# Patient Record
Sex: Male | Born: 1935 | Race: White | Hispanic: No | Smoking: Former smoker
Health system: Southern US, Community
[De-identification: ages and names within clinical notes are randomized; demographics above are authoritative.]

## PROBLEM LIST (undated history)

## (undated) DIAGNOSIS — I10 Essential (primary) hypertension: Secondary | ICD-10-CM

## (undated) DIAGNOSIS — E785 Hyperlipidemia, unspecified: Secondary | ICD-10-CM

## (undated) DIAGNOSIS — I639 Cerebral infarction, unspecified: Secondary | ICD-10-CM

## (undated) HISTORY — PX: ROTATOR CUFF REPAIR: SHX139

## (undated) HISTORY — PX: LUMBAR DISC SURGERY: SHX700

## (undated) HISTORY — PX: LUMBAR FUSION: SHX111

## (undated) HISTORY — PX: CLEFT LIP REPAIR: SUR1164

---

## 1999-06-02 ENCOUNTER — Encounter: Admission: RE | Admit: 1999-06-02 | Discharge: 1999-06-02 | Payer: Self-pay | Admitting: *Deleted

## 1999-06-02 ENCOUNTER — Encounter: Payer: Self-pay | Admitting: *Deleted

## 1999-06-03 ENCOUNTER — Encounter: Payer: Self-pay | Admitting: Urology

## 1999-06-03 ENCOUNTER — Encounter: Admission: RE | Admit: 1999-06-03 | Discharge: 1999-06-03 | Payer: Self-pay | Admitting: Urology

## 1999-06-13 ENCOUNTER — Encounter: Payer: Self-pay | Admitting: Urology

## 1999-06-15 ENCOUNTER — Observation Stay (HOSPITAL_COMMUNITY): Admission: RE | Admit: 1999-06-15 | Discharge: 1999-06-16 | Payer: Self-pay | Admitting: Urology

## 2001-01-08 ENCOUNTER — Encounter: Admission: RE | Admit: 2001-01-08 | Discharge: 2001-01-08 | Payer: Self-pay | Admitting: *Deleted

## 2001-01-08 ENCOUNTER — Encounter: Payer: Self-pay | Admitting: *Deleted

## 2001-03-20 ENCOUNTER — Ambulatory Visit (HOSPITAL_COMMUNITY): Admission: RE | Admit: 2001-03-20 | Discharge: 2001-03-20 | Payer: Self-pay | Admitting: Gastroenterology

## 2002-02-19 ENCOUNTER — Encounter: Payer: Self-pay | Admitting: Neurosurgery

## 2002-02-25 ENCOUNTER — Encounter: Payer: Self-pay | Admitting: Neurosurgery

## 2002-02-25 ENCOUNTER — Inpatient Hospital Stay (HOSPITAL_COMMUNITY): Admission: RE | Admit: 2002-02-25 | Discharge: 2002-03-01 | Payer: Self-pay | Admitting: Neurosurgery

## 2002-04-01 ENCOUNTER — Encounter: Admission: RE | Admit: 2002-04-01 | Discharge: 2002-04-01 | Payer: Self-pay | Admitting: Neurosurgery

## 2002-04-01 ENCOUNTER — Encounter: Payer: Self-pay | Admitting: Neurosurgery

## 2002-06-02 ENCOUNTER — Encounter: Payer: Self-pay | Admitting: Neurosurgery

## 2002-06-02 ENCOUNTER — Encounter: Admission: RE | Admit: 2002-06-02 | Discharge: 2002-06-02 | Payer: Self-pay | Admitting: Neurosurgery

## 2002-12-24 ENCOUNTER — Encounter: Admission: RE | Admit: 2002-12-24 | Discharge: 2002-12-24 | Payer: Self-pay | Admitting: Neurosurgery

## 2003-05-07 ENCOUNTER — Encounter: Admission: RE | Admit: 2003-05-07 | Discharge: 2003-05-07 | Payer: Self-pay | Admitting: Neurosurgery

## 2006-12-10 ENCOUNTER — Ambulatory Visit (HOSPITAL_COMMUNITY): Admission: RE | Admit: 2006-12-10 | Discharge: 2006-12-10 | Payer: Self-pay | Admitting: Ophthalmology

## 2007-12-21 ENCOUNTER — Inpatient Hospital Stay (HOSPITAL_COMMUNITY): Admission: EM | Admit: 2007-12-21 | Discharge: 2007-12-25 | Payer: Self-pay | Admitting: Emergency Medicine

## 2007-12-23 ENCOUNTER — Ambulatory Visit: Payer: Self-pay | Admitting: Vascular Surgery

## 2007-12-23 ENCOUNTER — Encounter (INDEPENDENT_AMBULATORY_CARE_PROVIDER_SITE_OTHER): Payer: Self-pay | Admitting: Internal Medicine

## 2008-06-30 ENCOUNTER — Encounter: Admission: RE | Admit: 2008-06-30 | Discharge: 2008-06-30 | Payer: Self-pay | Admitting: Otolaryngology

## 2008-07-09 ENCOUNTER — Ambulatory Visit (HOSPITAL_COMMUNITY): Admission: RE | Admit: 2008-07-09 | Discharge: 2008-07-09 | Payer: Self-pay | Admitting: Otolaryngology

## 2008-07-23 ENCOUNTER — Emergency Department (HOSPITAL_COMMUNITY): Admission: EM | Admit: 2008-07-23 | Discharge: 2008-07-23 | Payer: Self-pay | Admitting: Emergency Medicine

## 2008-12-01 ENCOUNTER — Observation Stay (HOSPITAL_COMMUNITY): Admission: EM | Admit: 2008-12-01 | Discharge: 2008-12-03 | Payer: Self-pay | Admitting: Emergency Medicine

## 2010-04-13 LAB — BASIC METABOLIC PANEL
CO2: 26 mEq/L (ref 19–32)
CO2: 28 mEq/L (ref 19–32)
Chloride: 103 mEq/L (ref 96–112)
Chloride: 106 mEq/L (ref 96–112)
GFR calc Af Amer: >60 mL/min (ref >60)
GFR calc non Af Amer: >60 mL/min (ref >60)
Glucose, Bld: 101 mg/dL — ABNORMAL HIGH (ref 70–99)
Potassium: 4.1 mEq/L (ref 3.5–5.1)
Sodium: 136 mEq/L (ref 135–145)
Sodium: 140 mEq/L (ref 135–145)

## 2010-04-13 LAB — ETHANOL: Alcohol, Ethyl (B): 172 mg/dL — ABNORMAL HIGH (ref 0–10)

## 2010-04-13 LAB — LACTIC ACID, PLASMA: Lactic Acid, Venous: 3.4 mmol/L — ABNORMAL HIGH (ref 0.5–2.2)

## 2010-04-13 LAB — COMPREHENSIVE METABOLIC PANEL
Alkaline Phosphatase: 52 U/L (ref 39–117)
BUN: 9 mg/dL (ref 6–23)
Creatinine, Ser: 0.74 mg/dL (ref 0.4–1.5)
Glucose, Bld: 131 mg/dL — ABNORMAL HIGH (ref 70–99)
Potassium: 3.1 mEq/L — ABNORMAL LOW (ref 3.5–5.1)
Total Bilirubin: 0.9 mg/dL (ref 0.3–1.2)
Total Protein: 7.4 g/dL (ref 6.0–8.3)

## 2010-04-13 LAB — POCT I-STAT, CHEM 8
Chloride: 103 mEq/L (ref 96–112)
Creatinine, Ser: 0.9 mg/dL (ref 0.4–1.5)
HCT: 42 % (ref 39.0–52.0)
Hemoglobin: 14.3 g/dL (ref 13.0–17.0)
Potassium: 3.2 mEq/L — ABNORMAL LOW (ref 3.5–5.1)
Sodium: 139 mEq/L (ref 135–145)

## 2010-04-13 LAB — CBC
HCT: 35.3 % — ABNORMAL LOW (ref 39.0–52.0)
Hemoglobin: 12.4 g/dL — ABNORMAL LOW (ref 13.0–17.0)
Hemoglobin: 12.9 g/dL — ABNORMAL LOW (ref 13.0–17.0)
MCHC: 35 g/dL (ref 30.0–36.0)
MCV: 93.8 fL (ref 78.0–100.0)
MCV: 94.6 fL (ref 78.0–100.0)
Platelets: 195 10*3/uL (ref 150–400)
RBC: 3.73 MIL/uL — ABNORMAL LOW (ref 4.22–5.81)
RBC: 3.92 MIL/uL — ABNORMAL LOW (ref 4.22–5.81)
RBC: 4.3 MIL/uL (ref 4.22–5.81)
WBC: 7 10*3/uL (ref 4.0–10.5)
WBC: 9.7 10*3/uL (ref 4.0–10.5)

## 2010-04-13 LAB — APTT: aPTT: 34 seconds (ref 24–37)

## 2010-04-13 LAB — PROTIME-INR
INR: 0.89 (ref 0.00–1.49)
Prothrombin Time: 12 seconds (ref 11.6–15.2)

## 2010-05-24 NOTE — H&P (Signed)
NAME:  Marc Malone, Marc Malone NO.:  [id number]   MEDICAL RECORD NO.:  [id number]          PATIENT TYPE:  EMS   LOCATION:  MAJO                         FACILITY:  [facility]   PHYSICIAN:  Corinna L. Lendell Caprice, MDDATE OF BIRTH:  27-Aug-1919   DATE OF ADMISSION:  12/21/2007  DATE OF DISCHARGE:                              HISTORY & PHYSICAL   CHIEF COMPLAINT:  Difficulty walking.   HISTORY OF PRESENT ILLNESS:  Ms. Marc Malone is a pleasant 77 year old white  male who woke up at around nine this morning and had difficulty walking.  His left leg and  arm seemed weak and clumsy.  He fell and nicked his  left hand.  He has no headache, no nausea, and he denies slurred speech  but there is report from friend that he apparently had slurred speech.  He thinks that his left-sided weakness might be improving slightly since  this morning.  He presented to the emergency room at about 3 o'clock  today.  He thought his weakness might resolve on its own.  He has never  had a stroke before.  He has hypertension and is otherwise quite healthy  and independent.  He lives alone in an independent living facility.   PAST MEDICAL HISTORY:  Hypertension, degenerative disc disease and  multiple laminectomies for same, history of posterior vitrectomy and  removal of foreign body of the left eye with posterior chamber  intraocular lens in 2008.   SOCIAL HISTORY:  The patient is not married.  He has a girlfriend.  He  has two children who lives out of state.  He quit smoking 20 years ago.  He drinks three martinis a day.   FAMILY HISTORY:  Noncontributory.   REVIEW OF SYSTEMS:  As above, otherwise negative.   PHYSICAL EXAMINATION:  Temperature is 98.3, blood pressure 165/80, pulse  66, respiratory rate 16, oxygen saturation 98% on room air.  In general, the patient is well-nourished, well-developed, in no acute  distress.  There are no friends or family present.  HEENT:  Normocephalic, atraumatic.  He  has an irregular left pupil.  His  right pupil is round and reactive.  Moist mucous membranes.  He has a  slight flattening of the left nasolabial fold.  He has a healed incision  over the right side of his upper lip.  NECK: Supple.  No carotid bruits.  No lymphadenopathy.  No thyromegaly.  LUNGS:  Clear to auscultation bilaterally without wheezes, rhonchi or  rales.  CARDIOVASCULAR:  Regular rate and rhythm without murmurs, gallops or  rubs ABDOMEN:  Normal bowel sounds, soft, nontender, nondistended.  GU AND RECTAL:  Deferred.  EXTREMITIES: No clubbing, cyanosis or edema.  Pulses are intact.  SKIN:  He has an abrasion over his left hand without any bleeding.  No  rash.  NEUROLOGIC:  The patient is alert and oriented x3.  slight flattening of  the left nasal labial fold.  He is unwilling or unable to cooperate with  visual field testing but otherwise his cranial nerves appear to be  intact.  Motor strength 5/5 bilaterally.  Sensation intact.  He has  dysmetria and clumsiness of the left hand.  I did not test gait due to  safety reasons nor did I test  Romberg.  His right patellar tendon  reflex is 1+, left is 2+.  Babinski is downgoing.  His speech is  slightly hesitant and possibly slightly slurred.  PSYCHIATRIC:  The patient is cooperative and somewhat in inappropriately  jovial.   LABORATORY DATA:  CBC, PT/PTT unremarkable.  Complete metabolic panel  significant for a total bilirubin of 1.7, calcium 10.6, otherwise  unremarkable.  Cardiac enzymes negative.  EKG shows normal sinus  rhythm..  Chest x-ray shows nothing acute.  CT of the brain shows age-  related atrophy and chronic small vessel disease, nothing acute   ASSESSMENT/PLAN:  1. Cerebrovascular infarct with left-sided clumsiness/weakness.  The      patient is outside the window of thrombolytics.  I will order      MRI/MRA of the brain, carotid Dopplers, echocardiogram, fasting      lipids, homocysteine, PT/OT consult.   He may be a candidate for      inpatient rehab.  I will start him on an aspirin a day and consider      a neuro consult.  2. Hypertension.  I will resume amlodipine and hold benazepril for now      to avoid relative hypotension in the setting of stroke.  3. History of daily alcohol use.  I will give thiamine.  The patient      denies drinking to excess or history of DTs.      Corinna L. Lendell Caprice, MD  Electronically Signed     CLS/MEDQ  D:  12/21/2007  T:  12/21/2007  Job:  [id number]   cc:   Thora Lance, M.D.

## 2010-05-24 NOTE — Discharge Summary (Signed)
NAME:  Marc Malone, Marc Malone NO.:  [id number]   MEDICAL RECORD NO.:  [id number]          PATIENT TYPE:  INP   LOCATION:  3039                         FACILITY:  [facility]   PHYSICIAN:  Thora Lance, M.D.  DATE OF BIRTH:  April 08, 1919   DATE OF ADMISSION:  12/21/2007  DATE OF DISCHARGE:  12/25/2007                               DISCHARGE SUMMARY   REASON FOR ADMISSION:  Marc Malone is an 77 year old white male who woke  up the morning of admission, had difficulty walking.  His left leg and  arm seemed to be weak and clumsy.  He fell and nicked his left hand.  He  presented to the emergency room at three in the afternoon.   SIGNIFICANT FINDINGS:  Temperature 98.3, blood pressure 165/80, heart  rate 66, respirations 16.  NECK:  No bruits.  LUNGS:  Clear.  HEART:  Regular rate and rhythm.  ABDOMEN:  Benign.  EXTREMITIES:  No edema.  NEUROLOGIC:  Alert and oriented x3, a slight flattening of nasolabial  fold, cranial nerves intact, dysmetria and clumsiness in the left hand  his.   LABORATORY DATA:  CBC:  Hemoglobin 14.2, hematocrit 41.7, WBC 6,  platelet 180.  Chemistry:  Sodium 140, potassium 3.8, chloride 104,  bicarbonate 29, BUN 14, creatinine 0.7, glucose 104.  Cardiac enzymes  negative.  Urinalysis was negative.  Lipid profile showed a cholesterol  of 248, LDL 154, HDL 81, triglycerides 66.   CT scan of the brain showed age-related atrophy and chronic small vessel  disease and no acute insult.   HOSPITAL COURSE:  The patient was admitted for a right brain stroke.  He  was placed on aspirin and then switched to Aggrenox.  He had a  significant headache on the Aggrenox and at discharge he was switched  from Aggrenox to Plavix with aspirin being stopped.  Clinically he  improved and had only very mild weakness in the left side at discharge.  He had some slurring of speech initially which seemed to improve.  He  had an MRI of the brain which did show an acute  infarction affecting the  right cerebral peduncle and thalamus.  An MRA of the brain showed no  occlusion or proximal vessel stenosis, there were some atherosclerotic  irregularities in the median small vessels diffusely.  A carotid Doppler  was done and showed no hemodynamically significant ICA stenosis  bilaterally and antegrade vertebral artery flow.  A 2D echocardiogram  showed normal ejection fraction and mild left atrial enlargement.  The  patient was seen by physical therapy and occupational therapy and both  felt that a short period of rehabilitation was needed prior to return  home to independent living.  The patient's blood pressure at discharge  on just amlodipine was running between 123 and 131 over 60s to 70s.  His  benazepril was not restarted.  He was placed on lipid lowering agent  simvastatin which will be continued as an outpatient.   DISCHARGE DIAGNOSES:  1. Right brain cerebrovascular accident with left hemiparesis.  2. Hypertension.  3. Chronic insomnia.  4. Lumbar degenerative  disk disease and chronic back pain.  5. Osteoporosis.  6. Nephrolithiasis.  7. Primary hyperparathyroidism.  8. Depression 2003.  9. Hypogonadism/erectile dysfunction.  10.Adenomatous colon polyps.  11.Benign prostatic hypertrophy and elevated PSA.   PROCEDURES:  1. CT scan of the brain.  2. MRI of the brain.  3. MRA of the brain.  4. Carotid ultrasound.  5. A 2D echocardiogram.   DISCHARGE MEDICATIONS:  1. Amlodipine 5 mg p.o. daily.  2. Plavix 75 mg p.o. daily.  3. Simvastatin 20 mg daily p.o. q.p.m.  4. Colace 100 grams b.i.d.  5. Ambien CR 12.5 mg nightly p.r.n.  6. Multivitamin daily.  7. Actonel 35 mg q. Week.  8. Os-Cal plus D 500 mg daily.  9. Laxative of choice.   DISPOSITION:  Discharged to [facility].   DIET:  Low-sodium diet.   ACTIVITY:  As per physical therapy and occupational therapy.   FOLLOWUP:  With Dr. Kirby Funk after release from  nursing home.           ______________________________  Thora Lance, M.D.     JJG/MEDQ  D:  12/25/2007  T:  12/25/2007  Job:  [id number]

## 2010-05-24 NOTE — Op Note (Signed)
NAME:  Marc Malone, Marc Malone NO.:  [id number]   MEDICAL RECORD NO.:  [id number]          PATIENT TYPE:  AMB   LOCATION:  SDS                          FACILITY:  [facility]   PHYSICIAN:  Alford Highland. Rankin, M.D.   DATE OF BIRTH:  [birth year]   DATE OF PROCEDURE:  DATE OF DISCHARGE:                               OPERATIVE REPORT   PREOPERATIVE DIAGNOSES:  1. Retained lens fragments left eye - nonmagnetic foreign body.  2. Aphakia left eye.   POSTOPERATIVE DIAGNOSES:  1. Retained lens fragments left eye - nonmagnetic foreign body.  2. Aphakia left eye.   PROCEDURES:  1. Posterior vitrectomy left eye.  2. Removal of nonmagnetic foreign body.  3. Insertion of primary posterior chamber intraocular lens into the      sulcus, model [id number] Alcon, power +21.5.   SURGEON:  Alford Highland. Rankin, M.D.   ANESTHESIA:  Local monitored anesthesia control.   INDICATIONS FOR PROCEDURE:  Patient is an 77 year old man who had  attempted cataract surgery earlier this morning with complication of  posterior capsular disruption and dislocation of the lens and nucleus  into the vitreous cavity.  This is an attempt to remove the lens.  This  is also an attempt to place a posterior chamber lens into the sulcus to  rejuvenate and to actually repair the aphakic state to a pseudophakic  state.  Patient and the family understand the risk of anesthesia  including recurrence, death, loss of the eye, including but not limited  to hemorrhage, infection, scarring, need for further surgery, no change  in vision, loss of vision, progression of disease despite intervention.  Appropriate signed consent was obtained.  Patient was taken to the  operating room.  In the operating room, appropriate monitors followed by  mild sedation.  2% Xylocaine was then injected retrobulbar for an  additional 5 mL lateral to fashion a modified Darel Hong.  The left  periocular region was sterilely prepped and draped in the usual  ophthalmic fashion.  A lids speculum was applied.  A 25-gauge trocar was  placed into the inferotemporal quadrant.  Superior trocars applied.  Core vitrectomy was then begun.  Large nucleus and cortical fragments  were seen over the posterior pole.  The anterior vitreous and the core  vitreous was removed so as to gain access to this nuclear fragment  without engaging the vitreous.  The vitreous skirt trimmed 360 degrees.  MVR blade was then used to make a separate superonasal sclerotomy for  the fragmatome to be used.  The fragmatome was then placed without  difficulty in the vitreous cavity and phacofragmentation was carried out  in the vitreous cavity without difficulty.   Lens was removed in this fashion.  The sclerotomy was then closed with 7-  0 Vicryl.  Remnants of the cortex were then cleaned up with vitrector  instrumentation.  Care was taken to clear the vitreous from the visual  axis.  At this time, the plugs were placed in the superior trocars.  Thereafter, crescent blade used to create a grooved limbal incision.  MVR blade was then used  to enter the anterior chamber.  The anterior  chamber was deep with Viscoat and the posterior chamber was then opened  up as well.  The intraocular lens was placed into the sulcus and rotated  in horizontal position without difficulty.  As the guy was hypotenuse  during this portion of the procedure, a small site of bleeding from the  6 o'clock position in the anterior chamber angle was noted.  This did  not disperse and was kept in place by placing further Viscoat.  The  anterior chamber wound was then closed with interrupted 10-0 nylon  sutures.  The wound was found to be secure.  The corneal wound  temporally was judged to be secure from earlier procedure today.   At this time, the superior trocars were removed.  Conjunctiva was then  closed with 7-0 Vicryl.  The infusion was removed.  A second look prior  to the closure and posterior  vitreous had also removed all remaining  visible fragments of the lens.   At this time, subconjunctival Decadron applied.  Sterile patch and Fox  shield applied.  Patient was taken to the PACU in good stable position.      Alford Highland Rankin, M.D.  Electronically Signed     GAR/MEDQ  D:  12/10/2006  T:  12/11/2006  Job:  [id number]   cc:   Molly Maduro L. Dione Booze, M.D.

## 2010-05-27 NOTE — Op Note (Signed)
NAME:  Marc Malone, Marc Malone NO.:  [id number]   MEDICAL RECORD NO.:  [id number]                   PATIENT TYPE:  INP   LOCATION:  3172                                 FACILITY:  [facility]   PHYSICIAN:  Kathaleen Maser. Pool, M.D.                 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   OPERATION PERFORMED:  Re-exploration of L4-5 laminectomy with bilateral L4-5  foraminotomies.  L4-5 posterior lumbar interbody fusion utilizing tangent  wedges and local autograft.  L4-5 posterolateral fusion utilizing pedicle  screw instrumentation and local autograft.   SURGEON:  Kathaleen Maser. Pool, M.D.   ASSISTANT:  Reinaldo Meeker, M.D.   ANESTHESIA:  General endotracheal.   INDICATIONS FOR PROCEDURE:  The patient is an 77 year old male with history  of a previous L3-4 and L4-5 decompressive laminectomy.  The patient presents  with intractable right lower extremity pain failing all conservative  management.  The symptoms are most consistent with a right-sided L4  radiculopathy but there is some degree of right-sided L5 symptoms as well.  The patient has an MRI scan which demonstrates a post laminectomy  spondylolisthesis at L4-5 with severe foraminal stenosis and a marked  osteophytic spur causing severe compression of the right-sided L5 nerve  root.  The patient has been counseled as to his options.  He has decided to  proceed with an L4-5 decompressive laminectomy with foraminotomies followed  interbody fusion augmented with posterolateral fusion and instrumentation.   DESCRIPTION OF PROCEDURE:  The patient was taken to the operating room and  placed on the table in the supine position.   After adequate level of  anesthesia was achieved, the patient was positioned prone onto a Wilson  frame and appropriately padded.  The patient's lumbar region was prepped and  draped sterilely.  A 10 blade was used to make a linear skin incision  overlying the  L3, 4, and 5 levels.  This was carried down sharply in the  midline.  A subperiosteal dissection was then performed exposing the lamina  and facet joints of L4, L5 and the transverse processes of L4 and L5.  Deep  self-retaining retractor was placed.  Intraoperative fluoroscopy was used  and the levels were confirmed.  The previous laminectomy site was dissected  free using dental instruments.  The laminectomy at L4 was completed using  Leksell rongeurs, Kerrison rongeurs and a high speed drill to remove the  entire lamina at L4 as well as the inferior facets of L4 bilaterally and the  majority of the superior facets of L5 bilaterally.  The superior aspect of  the L5 lamina was also resected.  All  bone was cleaned and used in later  autografting.  The ligamentum flavum and scar was then elevated and resected  in piecemeal fashion using Kerrison rongeurs.  The underlying thecal sac was  identified.  Wide foraminotomy was then performed along the course of the  exiting L4 and L5 nerve roots bilaterally.  Epidural venous plexus was  coagulated and cut.  Starting first with the patient's left side, the thecal  sac and nerve roots were retracted toward the midline.  The disk space was  incised with a 15 blade in rectangular fashion.  A wide disk space cleanout  was then achieved using pituitary rongeurs, upward angled pituitary  rongeurs, and Epstein curets.  After an aggressive diskectomy was performed,  attention was then placed to the contralateral side.  The contralateral side  was then once again protected and incised with a 15 blade in rectangular  fashion.  A wide disk space cleanout was then achieved using pituitary  rongeurs,  forward and backward angled Carlens curets, Kerrison rongeurs and  upward angled pituitary rongeurs.  After a very thorough diskectomy had been  performed.  The disk space was then sequentially distracted up to 10 mm with  a 10 mm distractor left in the patient's right side.  The thecal sac and  nerve roots were then protected o the left.  The disk space was then reamed  with a 10 mm box cutter and then cut with a 10 mm tangent chisel.  Soft  tissue was removed from the interspace.  A 10 x 26 mm tangent wedge was then  impacted into place and recessed approximately 2 mm from the posterior  cortical margin.  The distractor was removed from the patient's right side.  Nerve roots were protected.  The disk space was then reamed and then cut  with a 10 mm chisel once again.  Soft tissue was removed.  Disk space was  further curettaged. Morselized autograft was then packed into the interspace  and a second 10 x 26 mm tangent wedge was then impacted into place.  Intraoperative x-ray revealed good position of the bone grafts with good  reduction of the patient's deformity.  Pedicles at L4 and L5 were then  isolated using surface landmarks and intraoperative fluoroscopy.  Superficial bone overlying the pedicle was removed using a high speed drill.  Each pedicle was then probed using a pedicle awl.  Each pedicle awl track  was found to be solidly within bone.  Each pedicle awl track was then tapped  with a 5.25 mm screw tap.  Each screw tap  hole was then probed and found to  be solidly within bone.  6.75 x 45 mm spiral 90 screws were then placed  bilaterally at L4.  6.75 x 40 mm spiral 90 screws were placed bilaterally at  L5.  Transverse processes of L4 and L5 were then decorticated using a high  speed drill.  Morselized autograft was packed posterolaterally.  Short  segment titanium rod was then contoured and placed over the screw heads at L4 and L5.  Locking caps were then placed over the screw  heads.  Locking  caps were then engaged in sequential fashion while placing the construct  under compression.  Final images revealed good position of bone grafts and  hardware at the proper operative level with normal alignment of the spine.  Blunt probes passed easily out each neural foramen.  There was no evidence  of  injury to thecal sac or  nerve roots.  The wound was then irrigated one  final time.  Gelfoam was placed topically for hemostasis which was found to  be good.  Hemostasis in the muscle was achieved with electrocautery.  The  wound was then closed in layers with Vicryl sutures.  Steri-Strips and  sterile dressing were applied.  There were no apparent complications.  The  patient tolerated the procedure well and returned to the recovery room  postoperatively.                                                Henry A. Pool, M.D.    HAP/MEDQ  D:  02/25/2002  T:  02/25/2002  Job:  [id number]

## 2010-05-27 NOTE — Op Note (Signed)
[facility]  Patient:    Marc Malone, Marc Malone                      MRN: [id number] Proc. Date: 06/15/99 Adm. Date:  [id number] Disc. Date: [id number] Attending:  Laqueta Jean                           Operative Report  PREOPERATIVE DIAGNOSIS:  Right lower ureteral calculi.  POSTOPERATIVE DIAGNOSIS:  Right lower ureteral calculi.  OPERATION:  Cystourethroscopy, right retrograde pyelogram, right ureteroscopy, basket extraction of right lower ureteral calculi, right double J catheter.  SURGEON:  Sigmund I. Patsi Sears, M.D.  ANESTHESIA:  General (LMA).  PREPARATION:  After appropriate preanesthesia, the patient was brought to the operating room and placed on the operating table in the dorsal supine position, where general anesthesia was introduced.  He was then replaced in the low Allen stirrup dorsal lithotomy position, where the pubis was prepped with Betadine solution and draped in the usual sterile fashion.  PROCEDURE:  Cystourethroscopy was accomplished, which showed a hypertrophied prostate, somewhat edematous.  Right retrograde pyelogram was then performed, which showed two rather large calculi in the right lower third ureter, at the level of the pelvic brim, with dilation of the ureter at the level of the stones.  Ureteroscopy was accomplished and showed that the two stones were identifiable and were extracted separately.  There was difficulty in extracting the stones because of their unusual plate-like shape as well as size.  However, using multiple baskets, stones were extracted.  A large amount of clot was noted within the ureter as well as in the bladder and prostate, and a Foley catheter was placed.  Because of the patients age and because of the degree of difficulty of the procedure, it was elected to admit the patient for 23-hour observation.  I discussed this with the patients wife.  We will plan for 23-hour observation.  The patient  was given Xylocaine jelly in the urethra and in the ureter and IV Toradol prior to awakening.  He was covered with Ancef for the case. DD:  06/15/99 TD:  06/19/99 Job: [id number]

## 2010-05-27 NOTE — Procedures (Signed)
[facility]  Patient:    Marc Malone, BALDREE Visit Number: [id number] MRN: [id number]          Service Type: Attending:  Verlin Grills, M.D. Dictated by:   Verlin Grills, M.D. Proc. Date: 03/20/01                             Procedure Report  PROCEDURE:  Screening colonoscopy.  PROCEDURE INDICATION:  Mr. Khair Chasteen is a 77 year old male born 1919-01-25. Mr. Ditullio has a history of neoplastic but noncancerous colon polyps removed at [facility]. He has been undergoing routine surveillance colonoscopies since removal of the polyps. He is due for a repeat screening colonoscopy with polypectomy to prevent colon cancer.  I discussed with Mr. Willems the complications associated with colonoscopy and polypectomy including a 15 per 1000 risk of bleeding and 4 per 100 risk of colon perforation requiring surgical repair. Mr. Scalia has signed the operative permit.  ENDOSCOPIST:  Verlin Grills, M.D.  PREMEDICATION:  Versed 5 mg, Demerol 20 mg.  ENDOSCOPE:  Olympus Pediatric colonoscope.  DESCRIPTION OF PROCEDURE:  After obtaining informed consent, Mr. Vester was placed in the left lateral decubitus position. I administered intravenous Demerol and intravenous Versed to achieve conscious sedation for the procedure. The patients blood pressure, oxygen saturation and cardiac rhythm were monitored throughout the procedure and documented in the medical record.  Anal inspection was normal. Digital rectal exam revealed an enlarged but nonnodular prostate. The Olympus Pediatric video colonoscope was introduced into the rectum and advanced to the cecum. Mr. Mcglory received the Fleet Phospho-Soda colonic prep. The cecum was poorly prepped. There was adherent, sticky stool covering about 50% of the cecal mucosa which I could not flush out with water. Otherwise, colonic preparation for the exam today  was satisfactory.  Rectum:  Normal.  Sigmoid colon and descending colon:  Normal.  Splenic flexure:  Normal.  Transverse colon:  Normal.  Hepatic flexure:  Normal.  Ascending colon:  Normal.  Cecum and ileocecal valve:  Normal.  ASSESSMENT:  Normal proctocolonoscopy to the cecum. Marginal colonic preparation in the cecum.  RECOMMENDATIONS:  Repeat colonoscopy in approximately five years. Dictated by:   Verlin Grills, M.D. Attending:  Verlin Grills, M.D. DD:  03/20/01 TD:  03/20/01 Job: [id number]

## 2010-05-27 NOTE — Discharge Summary (Signed)
   NAME:  Marc Malone, Marc Malone NO.:  [id number]   MEDICAL RECORD NO.:  [id number]                   PATIENT TYPE:  INP   LOCATION:  3001                                 FACILITY:  [facility]   PHYSICIAN:  Kathaleen Maser. Pool, M.D.                 DATE OF BIRTH:  [birth year]   DATE OF ADMISSION:  02/25/2002  DATE OF DISCHARGE:  03/01/2002                                 DISCHARGE SUMMARY   FINAL DIAGNOSIS:  L4-5 grade 1 post laminectomy spondylolisthesis with  stenosis.   OPERATIONS AND TREATMENTS:  L4-5 redo decompressive laminectomy with  posterior lumbar interbody fusion utilizing Tangent wedges and local  autograft coupled with posterolateral fusion utilizing pedicle screw  fixation and local autograft.   HISTORY OF PRESENT ILLNESS:  Mr. Marc Malone is an 77 year old male who has a  history of a previous do-over lumbar decompression.  The patient presents  with worsening back and right lower extremity pain, failing all conservative  management.  MRI scanning and plain films demonstrate evidence of post  laminectomy, grade 1 L4-5 spondylolisthesis with severe neuroforaminal  stenosis.  The patient presents now for decompression and fusion at the L4-5  level.   HOSPITAL COURSE:  The patient was taken to the operating room where an  uncomplicated L4-5 decompression and fusion was performed.  Postoperatively,  the patient did quite well.  Right lower extremity pain was completely  resolved.  The patient was gradually mobilized.  He underwent physical and  occupational therapy while an inpatient.  On his fourth postoperative day,  the patient was walking the halls without difficulty. Pain level was  minimal.  The wound is healing well.  He is tolerating a regular diet.  He  desired discharge.   CONDITION AT DISCHARGE:  Improved.   DISCHARGE DISPOSITION:  The patient will be followed up in my office in one  week.   DISCHARGE MEDICATIONS:  Percocet and Valium as needed.   The patient is to  resume all preoperative medications.                                               Henry A. Pool, M.D.    HAP/MEDQ  D:  04/09/2002  T:  04/09/2002  Job:  [id number]

## 2010-10-13 LAB — CBC
HCT: 41.4 % (ref 39.0–52.0)
Hemoglobin: 13.8 g/dL (ref 13.0–17.0)
Hemoglobin: 14.2 g/dL (ref 13.0–17.0)
MCHC: 34.1 g/dL (ref 30.0–36.0)
MCV: 95.6 fL (ref 78.0–100.0)
Platelets: 167 10*3/uL (ref 150–400)
RDW: 14.3 % (ref 11.5–15.5)
WBC: 6 10*3/uL (ref 4.0–10.5)

## 2010-10-13 LAB — LIPID PANEL
Cholesterol: 248 mg/dL — ABNORMAL HIGH (ref 0–200)
HDL: 81 mg/dL (ref >39)
LDL Cholesterol: 154 mg/dL — ABNORMAL HIGH (ref 0–99)
Total CHOL/HDL Ratio: 3.1 RATIO
Triglycerides: 66 mg/dL (ref <150)

## 2010-10-13 LAB — CK TOTAL AND CKMB (NOT AT ARMC): Total CK: 125 U/L (ref 7–232)

## 2010-10-13 LAB — BASIC METABOLIC PANEL
CO2: 29 mEq/L (ref 19–32)
Calcium: 10.4 mg/dL (ref 8.4–10.5)
Creatinine, Ser: 0.77 mg/dL (ref 0.4–1.5)
Glucose, Bld: 104 mg/dL — ABNORMAL HIGH (ref 70–99)

## 2010-10-13 LAB — PROTIME-INR
INR: 0.9 (ref 0.00–1.49)
Prothrombin Time: 12.6 seconds (ref 11.6–15.2)

## 2010-10-13 LAB — COMPREHENSIVE METABOLIC PANEL
ALT: 19 U/L (ref 0–53)
CO2: 29 mEq/L (ref 19–32)
Calcium: 10.6 mg/dL — ABNORMAL HIGH (ref 8.4–10.5)
GFR calc non Af Amer: >60 mL/min (ref >60)
Glucose, Bld: 96 mg/dL (ref 70–99)
Sodium: 141 mEq/L (ref 135–145)

## 2010-10-13 LAB — URINALYSIS, ROUTINE W REFLEX MICROSCOPIC
Bilirubin Urine: NEGATIVE
Glucose, UA: NEGATIVE mg/dL
Hgb urine dipstick: NEGATIVE
Ketones, ur: 15 mg/dL — AB
Protein, ur: NEGATIVE mg/dL

## 2010-10-13 LAB — DIFFERENTIAL
Basophils Relative: 1 % (ref 0–1)
Eosinophils Absolute: 0 10*3/uL (ref 0.0–0.7)
Lymphs Abs: 1.5 10*3/uL (ref 0.7–4.0)
Neutrophils Relative %: 66 % (ref 43–77)

## 2010-10-17 LAB — CBC
Hemoglobin: 15.3
RDW: 13.8

## 2010-10-17 LAB — BASIC METABOLIC PANEL
BUN: 11
CO2: 29
Calcium: 10.7 — ABNORMAL HIGH
Chloride: 105
Creatinine, Ser: 0.79
GFR calc Af Amer: >60
GFR calc non Af Amer: >60
Glucose, Bld: 94
Potassium: 3.9
Sodium: 140

## 2011-01-19 DIAGNOSIS — H40059 Ocular hypertension, unspecified eye: Secondary | ICD-10-CM | POA: Diagnosis not present

## 2011-01-19 DIAGNOSIS — H26499 Other secondary cataract, unspecified eye: Secondary | ICD-10-CM | POA: Diagnosis not present

## 2011-01-19 DIAGNOSIS — H11159 Pinguecula, unspecified eye: Secondary | ICD-10-CM | POA: Diagnosis not present

## 2011-01-19 DIAGNOSIS — Z961 Presence of intraocular lens: Secondary | ICD-10-CM | POA: Diagnosis not present

## 2011-02-21 DIAGNOSIS — L570 Actinic keratosis: Secondary | ICD-10-CM | POA: Diagnosis not present

## 2011-02-21 DIAGNOSIS — L259 Unspecified contact dermatitis, unspecified cause: Secondary | ICD-10-CM | POA: Diagnosis not present

## 2011-04-06 DIAGNOSIS — M810 Age-related osteoporosis without current pathological fracture: Secondary | ICD-10-CM | POA: Diagnosis not present

## 2011-04-06 DIAGNOSIS — M545 Low back pain: Secondary | ICD-10-CM | POA: Diagnosis not present

## 2011-04-06 DIAGNOSIS — E213 Hyperparathyroidism, unspecified: Secondary | ICD-10-CM | POA: Diagnosis not present

## 2011-04-06 DIAGNOSIS — E785 Hyperlipidemia, unspecified: Secondary | ICD-10-CM | POA: Diagnosis not present

## 2011-04-06 DIAGNOSIS — G479 Sleep disorder, unspecified: Secondary | ICD-10-CM | POA: Diagnosis not present

## 2011-06-27 DIAGNOSIS — L570 Actinic keratosis: Secondary | ICD-10-CM | POA: Diagnosis not present

## 2011-06-27 DIAGNOSIS — D485 Neoplasm of uncertain behavior of skin: Secondary | ICD-10-CM | POA: Diagnosis not present

## 2011-06-27 DIAGNOSIS — L821 Other seborrheic keratosis: Secondary | ICD-10-CM | POA: Diagnosis not present

## 2011-07-11 DIAGNOSIS — R238 Other skin changes: Secondary | ICD-10-CM | POA: Diagnosis not present

## 2011-07-11 DIAGNOSIS — D485 Neoplasm of uncertain behavior of skin: Secondary | ICD-10-CM | POA: Diagnosis not present

## 2011-07-11 DIAGNOSIS — L570 Actinic keratosis: Secondary | ICD-10-CM | POA: Diagnosis not present

## 2011-07-25 DIAGNOSIS — L570 Actinic keratosis: Secondary | ICD-10-CM | POA: Diagnosis not present

## 2011-08-01 DIAGNOSIS — H40059 Ocular hypertension, unspecified eye: Secondary | ICD-10-CM | POA: Diagnosis not present

## 2011-08-01 DIAGNOSIS — Z961 Presence of intraocular lens: Secondary | ICD-10-CM | POA: Diagnosis not present

## 2011-08-01 DIAGNOSIS — H40019 Open angle with borderline findings, low risk, unspecified eye: Secondary | ICD-10-CM | POA: Diagnosis not present

## 2011-08-02 DIAGNOSIS — N401 Enlarged prostate with lower urinary tract symptoms: Secondary | ICD-10-CM | POA: Diagnosis not present

## 2011-09-12 DIAGNOSIS — L821 Other seborrheic keratosis: Secondary | ICD-10-CM | POA: Diagnosis not present

## 2011-10-09 DIAGNOSIS — G4700 Insomnia, unspecified: Secondary | ICD-10-CM | POA: Diagnosis not present

## 2011-10-09 DIAGNOSIS — I6990 Unspecified sequelae of unspecified cerebrovascular disease: Secondary | ICD-10-CM | POA: Diagnosis not present

## 2011-10-09 DIAGNOSIS — Z1331 Encounter for screening for depression: Secondary | ICD-10-CM | POA: Diagnosis not present

## 2011-10-09 DIAGNOSIS — Z23 Encounter for immunization: Secondary | ICD-10-CM | POA: Diagnosis not present

## 2011-11-07 DIAGNOSIS — L570 Actinic keratosis: Secondary | ICD-10-CM | POA: Diagnosis not present

## 2011-11-07 DIAGNOSIS — L821 Other seborrheic keratosis: Secondary | ICD-10-CM | POA: Diagnosis not present

## 2011-12-12 DIAGNOSIS — L738 Other specified follicular disorders: Secondary | ICD-10-CM | POA: Diagnosis not present

## 2011-12-12 DIAGNOSIS — L821 Other seborrheic keratosis: Secondary | ICD-10-CM | POA: Diagnosis not present

## 2011-12-12 DIAGNOSIS — L819 Disorder of pigmentation, unspecified: Secondary | ICD-10-CM | POA: Diagnosis not present

## 2011-12-12 DIAGNOSIS — I998 Other disorder of circulatory system: Secondary | ICD-10-CM | POA: Diagnosis not present

## 2011-12-26 DIAGNOSIS — L738 Other specified follicular disorders: Secondary | ICD-10-CM | POA: Diagnosis not present

## 2011-12-26 DIAGNOSIS — L299 Pruritus, unspecified: Secondary | ICD-10-CM | POA: Diagnosis not present

## 2012-02-12 DIAGNOSIS — M25559 Pain in unspecified hip: Secondary | ICD-10-CM | POA: Diagnosis not present

## 2012-02-12 DIAGNOSIS — M545 Low back pain: Secondary | ICD-10-CM | POA: Diagnosis not present

## 2012-03-20 DIAGNOSIS — L821 Other seborrheic keratosis: Secondary | ICD-10-CM | POA: Diagnosis not present

## 2012-03-20 DIAGNOSIS — L259 Unspecified contact dermatitis, unspecified cause: Secondary | ICD-10-CM | POA: Diagnosis not present

## 2012-04-08 DIAGNOSIS — E785 Hyperlipidemia, unspecified: Secondary | ICD-10-CM | POA: Diagnosis not present

## 2012-04-08 DIAGNOSIS — Z1331 Encounter for screening for depression: Secondary | ICD-10-CM | POA: Diagnosis not present

## 2012-04-08 DIAGNOSIS — M810 Age-related osteoporosis without current pathological fracture: Secondary | ICD-10-CM | POA: Diagnosis not present

## 2012-04-08 DIAGNOSIS — Z0000 Encounter for general adult medical examination without abnormal findings: Secondary | ICD-10-CM | POA: Diagnosis not present

## 2012-04-08 DIAGNOSIS — I10 Essential (primary) hypertension: Secondary | ICD-10-CM | POA: Diagnosis not present

## 2012-05-16 DIAGNOSIS — M810 Age-related osteoporosis without current pathological fracture: Secondary | ICD-10-CM | POA: Diagnosis not present

## 2012-08-02 DIAGNOSIS — R972 Elevated prostate specific antigen [PSA]: Secondary | ICD-10-CM | POA: Diagnosis not present

## 2012-08-02 DIAGNOSIS — N401 Enlarged prostate with lower urinary tract symptoms: Secondary | ICD-10-CM | POA: Diagnosis not present

## 2012-08-02 DIAGNOSIS — N529 Male erectile dysfunction, unspecified: Secondary | ICD-10-CM | POA: Diagnosis not present

## 2012-08-06 DIAGNOSIS — H04129 Dry eye syndrome of unspecified lacrimal gland: Secondary | ICD-10-CM | POA: Diagnosis not present

## 2012-08-06 DIAGNOSIS — H11159 Pinguecula, unspecified eye: Secondary | ICD-10-CM | POA: Diagnosis not present

## 2012-08-06 DIAGNOSIS — Z961 Presence of intraocular lens: Secondary | ICD-10-CM | POA: Diagnosis not present

## 2012-08-06 DIAGNOSIS — H40059 Ocular hypertension, unspecified eye: Secondary | ICD-10-CM | POA: Diagnosis not present

## 2012-09-17 DIAGNOSIS — N400 Enlarged prostate without lower urinary tract symptoms: Secondary | ICD-10-CM | POA: Diagnosis not present

## 2012-09-17 DIAGNOSIS — I10 Essential (primary) hypertension: Secondary | ICD-10-CM | POA: Diagnosis not present

## 2012-09-27 DIAGNOSIS — Z23 Encounter for immunization: Secondary | ICD-10-CM | POA: Diagnosis not present

## 2012-10-08 DIAGNOSIS — R51 Headache: Secondary | ICD-10-CM | POA: Diagnosis not present

## 2013-03-18 DIAGNOSIS — R42 Dizziness and giddiness: Secondary | ICD-10-CM | POA: Diagnosis not present

## 2013-03-18 DIAGNOSIS — E785 Hyperlipidemia, unspecified: Secondary | ICD-10-CM | POA: Diagnosis not present

## 2013-03-18 DIAGNOSIS — I10 Essential (primary) hypertension: Secondary | ICD-10-CM | POA: Diagnosis not present

## 2013-05-13 DIAGNOSIS — Z0000 Encounter for general adult medical examination without abnormal findings: Secondary | ICD-10-CM | POA: Diagnosis not present

## 2013-06-03 DIAGNOSIS — H40019 Open angle with borderline findings, low risk, unspecified eye: Secondary | ICD-10-CM | POA: Diagnosis not present

## 2013-06-03 DIAGNOSIS — Z961 Presence of intraocular lens: Secondary | ICD-10-CM | POA: Diagnosis not present

## 2013-06-03 DIAGNOSIS — H04129 Dry eye syndrome of unspecified lacrimal gland: Secondary | ICD-10-CM | POA: Diagnosis not present

## 2013-06-03 DIAGNOSIS — H47029 Hemorrhage in optic nerve sheath, unspecified eye: Secondary | ICD-10-CM | POA: Diagnosis not present

## 2013-07-04 DIAGNOSIS — M549 Dorsalgia, unspecified: Secondary | ICD-10-CM | POA: Diagnosis not present

## 2013-07-04 DIAGNOSIS — M5430 Sciatica, unspecified side: Secondary | ICD-10-CM | POA: Diagnosis not present

## 2013-08-13 DIAGNOSIS — H40019 Open angle with borderline findings, low risk, unspecified eye: Secondary | ICD-10-CM | POA: Diagnosis not present

## 2013-08-13 DIAGNOSIS — H04129 Dry eye syndrome of unspecified lacrimal gland: Secondary | ICD-10-CM | POA: Diagnosis not present

## 2013-08-13 DIAGNOSIS — Z961 Presence of intraocular lens: Secondary | ICD-10-CM | POA: Diagnosis not present

## 2013-08-13 DIAGNOSIS — H35359 Cystoid macular degeneration, unspecified eye: Secondary | ICD-10-CM | POA: Diagnosis not present

## 2013-08-20 DIAGNOSIS — N401 Enlarged prostate with lower urinary tract symptoms: Secondary | ICD-10-CM | POA: Diagnosis not present

## 2013-08-27 DIAGNOSIS — H04129 Dry eye syndrome of unspecified lacrimal gland: Secondary | ICD-10-CM | POA: Diagnosis not present

## 2013-08-27 DIAGNOSIS — Z961 Presence of intraocular lens: Secondary | ICD-10-CM | POA: Diagnosis not present

## 2013-08-27 DIAGNOSIS — H35359 Cystoid macular degeneration, unspecified eye: Secondary | ICD-10-CM | POA: Diagnosis not present

## 2013-08-27 DIAGNOSIS — H353190 Nonexudative age-related macular degeneration, unspecified eye, stage unspecified: Secondary | ICD-10-CM | POA: Diagnosis not present

## 2013-09-02 DIAGNOSIS — H35359 Cystoid macular degeneration, unspecified eye: Secondary | ICD-10-CM | POA: Diagnosis not present

## 2013-09-02 DIAGNOSIS — H35369 Drusen (degenerative) of macula, unspecified eye: Secondary | ICD-10-CM | POA: Diagnosis not present

## 2013-09-17 DIAGNOSIS — Z23 Encounter for immunization: Secondary | ICD-10-CM | POA: Diagnosis not present

## 2013-09-17 DIAGNOSIS — Z1331 Encounter for screening for depression: Secondary | ICD-10-CM | POA: Diagnosis not present

## 2013-09-17 DIAGNOSIS — I10 Essential (primary) hypertension: Secondary | ICD-10-CM | POA: Diagnosis not present

## 2013-09-17 DIAGNOSIS — G479 Sleep disorder, unspecified: Secondary | ICD-10-CM | POA: Diagnosis not present

## 2013-09-18 DIAGNOSIS — H35359 Cystoid macular degeneration, unspecified eye: Secondary | ICD-10-CM | POA: Diagnosis not present

## 2013-10-30 DIAGNOSIS — H35352 Cystoid macular degeneration, left eye: Secondary | ICD-10-CM | POA: Diagnosis not present

## 2013-11-05 DIAGNOSIS — H1131 Conjunctival hemorrhage, right eye: Secondary | ICD-10-CM | POA: Diagnosis not present

## 2013-12-11 DIAGNOSIS — H3532 Exudative age-related macular degeneration: Secondary | ICD-10-CM | POA: Diagnosis not present

## 2013-12-11 DIAGNOSIS — H35352 Cystoid macular degeneration, left eye: Secondary | ICD-10-CM | POA: Diagnosis not present

## 2013-12-15 DIAGNOSIS — H3532 Exudative age-related macular degeneration: Secondary | ICD-10-CM | POA: Diagnosis not present

## 2013-12-15 DIAGNOSIS — H35052 Retinal neovascularization, unspecified, left eye: Secondary | ICD-10-CM | POA: Diagnosis not present

## 2014-01-21 DIAGNOSIS — H3532 Exudative age-related macular degeneration: Secondary | ICD-10-CM | POA: Diagnosis not present

## 2014-01-21 DIAGNOSIS — H35052 Retinal neovascularization, unspecified, left eye: Secondary | ICD-10-CM | POA: Diagnosis not present

## 2014-02-26 DIAGNOSIS — H3532 Exudative age-related macular degeneration: Secondary | ICD-10-CM | POA: Diagnosis not present

## 2014-03-03 DIAGNOSIS — Z961 Presence of intraocular lens: Secondary | ICD-10-CM | POA: Diagnosis not present

## 2014-03-03 DIAGNOSIS — H35352 Cystoid macular degeneration, left eye: Secondary | ICD-10-CM | POA: Diagnosis not present

## 2014-03-03 DIAGNOSIS — H3531 Nonexudative age-related macular degeneration: Secondary | ICD-10-CM | POA: Diagnosis not present

## 2014-03-19 DIAGNOSIS — E780 Pure hypercholesterolemia: Secondary | ICD-10-CM | POA: Diagnosis not present

## 2014-03-19 DIAGNOSIS — I10 Essential (primary) hypertension: Secondary | ICD-10-CM | POA: Diagnosis not present

## 2014-03-19 DIAGNOSIS — Z23 Encounter for immunization: Secondary | ICD-10-CM | POA: Diagnosis not present

## 2014-04-02 DIAGNOSIS — H3532 Exudative age-related macular degeneration: Secondary | ICD-10-CM | POA: Diagnosis not present

## 2014-05-07 DIAGNOSIS — H3532 Exudative age-related macular degeneration: Secondary | ICD-10-CM | POA: Diagnosis not present

## 2014-06-11 DIAGNOSIS — H3532 Exudative age-related macular degeneration: Secondary | ICD-10-CM | POA: Diagnosis not present

## 2014-06-16 DIAGNOSIS — R030 Elevated blood-pressure reading, without diagnosis of hypertension: Secondary | ICD-10-CM | POA: Diagnosis not present

## 2014-06-16 DIAGNOSIS — S61206A Unspecified open wound of right little finger without damage to nail, initial encounter: Secondary | ICD-10-CM | POA: Diagnosis not present

## 2014-06-25 DIAGNOSIS — L821 Other seborrheic keratosis: Secondary | ICD-10-CM | POA: Diagnosis not present

## 2014-06-25 DIAGNOSIS — S0081XA Abrasion of other part of head, initial encounter: Secondary | ICD-10-CM | POA: Diagnosis not present

## 2014-06-25 DIAGNOSIS — L700 Acne vulgaris: Secondary | ICD-10-CM | POA: Diagnosis not present

## 2014-06-25 DIAGNOSIS — L570 Actinic keratosis: Secondary | ICD-10-CM | POA: Diagnosis not present

## 2014-07-09 DIAGNOSIS — L821 Other seborrheic keratosis: Secondary | ICD-10-CM | POA: Diagnosis not present

## 2014-07-21 DIAGNOSIS — H3532 Exudative age-related macular degeneration: Secondary | ICD-10-CM | POA: Diagnosis not present

## 2014-08-06 DIAGNOSIS — L821 Other seborrheic keratosis: Secondary | ICD-10-CM | POA: Diagnosis not present

## 2014-08-24 DIAGNOSIS — N401 Enlarged prostate with lower urinary tract symptoms: Secondary | ICD-10-CM | POA: Diagnosis not present

## 2014-08-24 DIAGNOSIS — N138 Other obstructive and reflux uropathy: Secondary | ICD-10-CM | POA: Diagnosis not present

## 2014-08-25 DIAGNOSIS — H3532 Exudative age-related macular degeneration: Secondary | ICD-10-CM | POA: Diagnosis not present

## 2014-08-27 DIAGNOSIS — L821 Other seborrheic keratosis: Secondary | ICD-10-CM | POA: Diagnosis not present

## 2014-09-24 DIAGNOSIS — Z1389 Encounter for screening for other disorder: Secondary | ICD-10-CM | POA: Diagnosis not present

## 2014-09-24 DIAGNOSIS — Z0000 Encounter for general adult medical examination without abnormal findings: Secondary | ICD-10-CM | POA: Diagnosis not present

## 2014-09-24 DIAGNOSIS — Z23 Encounter for immunization: Secondary | ICD-10-CM | POA: Diagnosis not present

## 2014-09-24 DIAGNOSIS — F5104 Psychophysiologic insomnia: Secondary | ICD-10-CM | POA: Diagnosis not present

## 2014-09-25 ENCOUNTER — Other Ambulatory Visit: Payer: Self-pay | Admitting: Nurse Practitioner

## 2014-09-25 ENCOUNTER — Ambulatory Visit
Admission: RE | Admit: 2014-09-25 | Discharge: 2014-09-25 | Disposition: A | Discharge status: Home / Self Care | Payer: Medicare Other | Source: Ambulatory Visit | Attending: Nurse Practitioner | Admitting: Nurse Practitioner

## 2014-09-25 DIAGNOSIS — M542 Cervicalgia: Secondary | ICD-10-CM

## 2014-09-25 DIAGNOSIS — M25512 Pain in left shoulder: Secondary | ICD-10-CM

## 2014-09-25 DIAGNOSIS — W19XXXA Unspecified fall, initial encounter: Secondary | ICD-10-CM | POA: Diagnosis not present

## 2014-09-25 DIAGNOSIS — M25511 Pain in right shoulder: Secondary | ICD-10-CM

## 2014-09-25 DIAGNOSIS — M898X1 Other specified disorders of bone, shoulder: Secondary | ICD-10-CM

## 2014-09-28 DIAGNOSIS — M79602 Pain in left arm: Secondary | ICD-10-CM | POA: Diagnosis not present

## 2014-09-29 DIAGNOSIS — M25511 Pain in right shoulder: Secondary | ICD-10-CM | POA: Diagnosis not present

## 2014-09-29 DIAGNOSIS — M25512 Pain in left shoulder: Secondary | ICD-10-CM | POA: Diagnosis not present

## 2014-09-29 DIAGNOSIS — L821 Other seborrheic keratosis: Secondary | ICD-10-CM | POA: Diagnosis not present

## 2014-09-29 DIAGNOSIS — H3532 Exudative age-related macular degeneration: Secondary | ICD-10-CM | POA: Diagnosis not present

## 2014-10-13 DIAGNOSIS — M25512 Pain in left shoulder: Secondary | ICD-10-CM | POA: Diagnosis not present

## 2014-10-29 DIAGNOSIS — L821 Other seborrheic keratosis: Secondary | ICD-10-CM | POA: Diagnosis not present

## 2014-11-03 DIAGNOSIS — H353221 Exudative age-related macular degeneration, left eye, with active choroidal neovascularization: Secondary | ICD-10-CM | POA: Diagnosis not present

## 2014-12-08 DIAGNOSIS — H353221 Exudative age-related macular degeneration, left eye, with active choroidal neovascularization: Secondary | ICD-10-CM | POA: Diagnosis not present

## 2014-12-09 DIAGNOSIS — R6884 Jaw pain: Secondary | ICD-10-CM | POA: Diagnosis not present

## 2015-01-12 DIAGNOSIS — H35352 Cystoid macular degeneration, left eye: Secondary | ICD-10-CM | POA: Diagnosis not present

## 2015-01-12 DIAGNOSIS — H353221 Exudative age-related macular degeneration, left eye, with active choroidal neovascularization: Secondary | ICD-10-CM | POA: Diagnosis not present

## 2015-02-16 DIAGNOSIS — H35352 Cystoid macular degeneration, left eye: Secondary | ICD-10-CM | POA: Diagnosis not present

## 2015-02-16 DIAGNOSIS — H353221 Exudative age-related macular degeneration, left eye, with active choroidal neovascularization: Secondary | ICD-10-CM | POA: Diagnosis not present

## 2015-03-30 DIAGNOSIS — H353221 Exudative age-related macular degeneration, left eye, with active choroidal neovascularization: Secondary | ICD-10-CM | POA: Diagnosis not present

## 2015-03-31 DIAGNOSIS — H02831 Dermatochalasis of right upper eyelid: Secondary | ICD-10-CM | POA: Diagnosis not present

## 2015-03-31 DIAGNOSIS — H35352 Cystoid macular degeneration, left eye: Secondary | ICD-10-CM | POA: Diagnosis not present

## 2015-03-31 DIAGNOSIS — H02834 Dermatochalasis of left upper eyelid: Secondary | ICD-10-CM | POA: Diagnosis not present

## 2015-03-31 DIAGNOSIS — Z961 Presence of intraocular lens: Secondary | ICD-10-CM | POA: Diagnosis not present

## 2015-03-31 DIAGNOSIS — H353131 Nonexudative age-related macular degeneration, bilateral, early dry stage: Secondary | ICD-10-CM | POA: Diagnosis not present

## 2015-04-12 DIAGNOSIS — R05 Cough: Secondary | ICD-10-CM | POA: Diagnosis not present

## 2015-04-12 DIAGNOSIS — J40 Bronchitis, not specified as acute or chronic: Secondary | ICD-10-CM | POA: Diagnosis not present

## 2015-04-12 DIAGNOSIS — R0982 Postnasal drip: Secondary | ICD-10-CM | POA: Diagnosis not present

## 2015-04-15 ENCOUNTER — Emergency Department (HOSPITAL_COMMUNITY): Payer: Medicare Other

## 2015-04-15 ENCOUNTER — Emergency Department (HOSPITAL_COMMUNITY)
Admission: EM | Admit: 2015-04-15 | Discharge: 2015-04-15 | Disposition: A | Discharge status: Home / Self Care | Payer: Medicare Other | Attending: Emergency Medicine | Admitting: Emergency Medicine

## 2015-04-15 ENCOUNTER — Encounter (HOSPITAL_COMMUNITY): Payer: Self-pay

## 2015-04-15 DIAGNOSIS — W01198A Fall on same level from slipping, tripping and stumbling with subsequent striking against other object, initial encounter: Secondary | ICD-10-CM | POA: Diagnosis not present

## 2015-04-15 DIAGNOSIS — Y9289 Other specified places as the place of occurrence of the external cause: Secondary | ICD-10-CM | POA: Insufficient documentation

## 2015-04-15 DIAGNOSIS — Z791 Long term (current) use of non-steroidal anti-inflammatories (NSAID): Secondary | ICD-10-CM | POA: Diagnosis not present

## 2015-04-15 DIAGNOSIS — S79911A Unspecified injury of right hip, initial encounter: Secondary | ICD-10-CM | POA: Diagnosis not present

## 2015-04-15 DIAGNOSIS — Z8673 Personal history of transient ischemic attack (TIA), and cerebral infarction without residual deficits: Secondary | ICD-10-CM | POA: Diagnosis not present

## 2015-04-15 DIAGNOSIS — M545 Low back pain, unspecified: Secondary | ICD-10-CM

## 2015-04-15 DIAGNOSIS — M549 Dorsalgia, unspecified: Secondary | ICD-10-CM | POA: Diagnosis not present

## 2015-04-15 DIAGNOSIS — S0990XA Unspecified injury of head, initial encounter: Secondary | ICD-10-CM | POA: Insufficient documentation

## 2015-04-15 DIAGNOSIS — W19XXXA Unspecified fall, initial encounter: Secondary | ICD-10-CM

## 2015-04-15 DIAGNOSIS — Z792 Long term (current) use of antibiotics: Secondary | ICD-10-CM | POA: Insufficient documentation

## 2015-04-15 DIAGNOSIS — R259 Unspecified abnormal involuntary movements: Secondary | ICD-10-CM | POA: Diagnosis not present

## 2015-04-15 DIAGNOSIS — S3992XA Unspecified injury of lower back, initial encounter: Secondary | ICD-10-CM | POA: Diagnosis not present

## 2015-04-15 DIAGNOSIS — Z79899 Other long term (current) drug therapy: Secondary | ICD-10-CM | POA: Insufficient documentation

## 2015-04-15 DIAGNOSIS — E785 Hyperlipidemia, unspecified: Secondary | ICD-10-CM | POA: Diagnosis not present

## 2015-04-15 DIAGNOSIS — Z87891 Personal history of nicotine dependence: Secondary | ICD-10-CM | POA: Diagnosis not present

## 2015-04-15 DIAGNOSIS — Z7902 Long term (current) use of antithrombotics/antiplatelets: Secondary | ICD-10-CM | POA: Diagnosis not present

## 2015-04-15 DIAGNOSIS — Y998 Other external cause status: Secondary | ICD-10-CM | POA: Diagnosis not present

## 2015-04-15 DIAGNOSIS — Y9389 Activity, other specified: Secondary | ICD-10-CM | POA: Insufficient documentation

## 2015-04-15 DIAGNOSIS — T1490 Injury, unspecified: Secondary | ICD-10-CM | POA: Diagnosis not present

## 2015-04-15 HISTORY — DX: Cerebral infarction, unspecified: I63.9

## 2015-04-15 HISTORY — DX: Hyperlipidemia, unspecified: E78.5

## 2015-04-15 MED ORDER — HYDROCODONE-ACETAMINOPHEN 5-325 MG PO TABS
1.0000 | ORAL_TABLET | Freq: Once | ORAL | Status: AC
  Administered 2015-04-15: 1 via ORAL
  Filled 2015-04-15: qty 1

## 2015-04-15 MED ORDER — CYCLOBENZAPRINE HCL 10 MG PO TABS: 10.0000 mg | ORAL_TABLET | Freq: Three times a day (TID) | ORAL | Status: DC | PRN

## 2015-04-15 NOTE — ED Notes (Signed)
[facility] will be in route to [facility] to transport pt.

## 2015-04-15 NOTE — ED Notes (Signed)
Patient transported by [facility] from [facility].  Patient fell last night at approx 2100.  Patient states he fell backward while trying to get into bed.  Patient denies dizziness prior to fall.  Patient states he hit head when he fell and patient is on Plavix.  Patient is c/o right lower back pain today.  No obvious injuries noted.  Patient at baseline per NH staff.

## 2015-04-15 NOTE — ED Provider Notes (Signed)
CSN: [id number]     Arrival date & time 04/15/15  1237 History   First MD Initiated Contact with Patient 04/15/15 1347     Chief Complaint  Patient presents with  . Fall      HPI Patient fell last night at approximately 9:00 and states that he fell backwards getting into bed.  He was able to go to bed and when he awoke this morning he reports ongoing discomfort and pain in his right low back.  He says much more sore today.  He does report mild headache injury without loss consciousness.  He has no headache at this time.  He is on Plavix.  His pain is mild in severity.  No other complaints.  Full range of motion of right hip.  Denies weakness of his lower extremities   Past Medical History  Diagnosis Date  . Stroke ([place])   . Hyperlipidemia    Past Surgical History  Procedure Laterality Date  . Lumbar fusion    . Lumbar disc surgery    . Rotator cuff repair Bilateral   . Cleft lip repair     History reviewed. No pertinent family history. Social History  Substance Use Topics  . Smoking status: Former Research scientist (life sciences)  . Smokeless tobacco: None  . Alcohol Use: Yes     Comment: 1-2 drinks per day    Review of Systems  All other systems reviewed and are negative.     Allergies  Review of patient's allergies indicates no known allergies.  Home Medications   Prior to Admission medications   Medication Sig Start Date End Date Taking? Authorizing Provider  azithromycin (ZITHROMAX) 250 MG tablet Take 250 mg by mouth daily.  04/12/15  Yes Historical Provider, MD  clopidogrel (PLAVIX) 75 MG tablet Take 75 mg by mouth once.  02/16/15  Yes Historical Provider, MD  dextromethorphan-guaiFENesin (MUCINEX DM) 30-600 MG 12hr tablet Take 1 tablet by mouth 2 (two) times daily.   Yes Historical Provider, MD  ibuprofen (ADVIL,MOTRIN) 200 MG tablet Take 200 mg by mouth every 6 (six) hours as needed for mild pain.   Yes Historical Provider, MD  loratadine (CLARITIN) 10 MG tablet Take 10 mg by mouth daily  as needed for allergies.  04/12/15  Yes Historical Provider, MD  PROLENSA 0.07 % SOLN Place 1 drop into both eyes 2 (two) times daily.  03/08/15  Yes Historical Provider, MD  simvastatin (ZOCOR) 20 MG tablet Take 20 mg by mouth daily.  03/05/15  Yes Historical Provider, MD  traMADol (ULTRAM) 50 MG tablet Take 50 mg by mouth every 6 (six) hours as needed for moderate pain.  04/09/15  Yes Historical Provider, MD  zolpidem (AMBIEN CR) 12.5 MG CR tablet Take 12.5 mg by mouth at bedtime.  04/05/15  Yes Historical Provider, MD  cyclobenzaprine (FLEXERIL) 10 MG tablet Take 1 tablet (10 mg total) by mouth 3 (three) times daily as needed for muscle spasms. 04/15/15   Jola Schmidt, MD   BP 131/58 mmHg  Pulse 66  Resp 15  Ht 5\' 4"  (1.626 m)  Wt 135 lb (61.236 kg)  BMI 23.16 kg/m2  SpO2 96% Physical Exam  Constitutional: He is oriented to person, place, and time. He appears well-developed and well-nourished.  HENT:  Head: Normocephalic.  Eyes: EOM are normal.  Neck: Normal range of motion.  Pulmonary/Chest: Effort normal.  Abdominal: He exhibits no distension.  Musculoskeletal: Normal range of motion.  Mild lumbar and paralumbar tenderness worse on the right.  No significant spasm.  Full range of motion of right knee and right hip.  Mild tenderness of the right posterior SI joint  Neurological: He is alert and oriented to person, place, and time.  Psychiatric: He has a normal mood and affect.  Nursing note and vitals reviewed.   ED Course  Procedures (including critical care time) Labs Review Labs Reviewed - No data to display  Imaging Review Dg Lumbar Spine Complete  04/15/2015  CLINICAL DATA:  Fall. EXAM: LUMBAR SPINE - COMPLETE 4+ VIEW COMPARISON:  4/28/5 FINDINGS: There is a scoliosis deformity involving the lumbar spine which is convex towards the left. Laminectomy and posterior hardware fixation of the L4-5 vertebra identified. There is moderate to advanced degenerative changes at the L5-S1  level. Aortic atherosclerosis noted IMPRESSION: 1. Status post surgical fusion of L4-5 vertebra. 2. Scoliosis and degenerative disc disease noted. 3. No acute findings identified. 4. Aortic atherosclerosis Electronically Signed   By: Kerby Moors M.D.   On: 04/15/2015 14:40   Dg Hip Unilat With Pelvis 2-3 Views Right  04/15/2015  CLINICAL DATA:  Fall EXAM: DG HIP (WITH OR WITHOUT PELVIS) 2-3V RIGHT COMPARISON:  None. FINDINGS: Hardware scratch set posterior hardware fixation of the L4-5 vertebra is again noted. Moderate bilateral scratch set mild bilateral hip osteoarthritis noted. No fracture or dislocation identified. IMPRESSION: 1. No fracture or dislocation. If there is high clinical suspicion for occult fracture or the patient refuses to weightbear, consider further evaluation with MRI. Although CT is expeditious, evidence is lacking regarding accuracy of CT over plain film radiography. Electronically Signed   By: Kerby Moors M.D.   On: 04/15/2015 14:38   I have personally reviewed and evaluated these images and lab results as part of my medical decision-making.   EKG Interpretation None      MDM   Final diagnoses:  Fall, initial encounter  Right-sided low back pain without sciatica    Imaging negative.  Discharge home in good condition.  Ambulatory.  No indication for CT imaging of the head.  Overall well-appearing.  Mechanical fall.  Patient understands return to the ER for new or worsening symptoms.    Jola Schmidt, MD 04/15/15 1524

## 2015-04-15 NOTE — ED Notes (Signed)
Bed: [id number] Expected date:  Expected time:  Means of arrival:  Comments: EMS/fall

## 2015-04-15 NOTE — ED Notes (Signed)
Pt ambulated in hallway unassisted pt used own cane with no assistance.

## 2015-04-15 NOTE — ED Notes (Signed)
Called [facility] at [phone]. Informed facility that pt was on the way.

## 2015-04-26 DIAGNOSIS — M545 Low back pain: Secondary | ICD-10-CM | POA: Diagnosis not present

## 2015-04-26 DIAGNOSIS — I10 Essential (primary) hypertension: Secondary | ICD-10-CM | POA: Diagnosis not present

## 2015-05-18 DIAGNOSIS — H353221 Exudative age-related macular degeneration, left eye, with active choroidal neovascularization: Secondary | ICD-10-CM | POA: Diagnosis not present

## 2015-05-18 DIAGNOSIS — H35352 Cystoid macular degeneration, left eye: Secondary | ICD-10-CM | POA: Diagnosis not present

## 2015-07-06 DIAGNOSIS — H353221 Exudative age-related macular degeneration, left eye, with active choroidal neovascularization: Secondary | ICD-10-CM | POA: Diagnosis not present

## 2015-08-27 DIAGNOSIS — L570 Actinic keratosis: Secondary | ICD-10-CM | POA: Diagnosis not present

## 2015-08-27 DIAGNOSIS — L821 Other seborrheic keratosis: Secondary | ICD-10-CM | POA: Diagnosis not present

## 2015-08-27 DIAGNOSIS — L218 Other seborrheic dermatitis: Secondary | ICD-10-CM | POA: Diagnosis not present

## 2015-09-06 DIAGNOSIS — H353221 Exudative age-related macular degeneration, left eye, with active choroidal neovascularization: Secondary | ICD-10-CM | POA: Diagnosis not present

## 2015-09-28 DIAGNOSIS — L814 Other melanin hyperpigmentation: Secondary | ICD-10-CM | POA: Diagnosis not present

## 2015-09-28 DIAGNOSIS — L821 Other seborrheic keratosis: Secondary | ICD-10-CM | POA: Diagnosis not present

## 2015-10-27 DIAGNOSIS — N400 Enlarged prostate without lower urinary tract symptoms: Secondary | ICD-10-CM | POA: Diagnosis not present

## 2015-10-27 DIAGNOSIS — R29898 Other symptoms and signs involving the musculoskeletal system: Secondary | ICD-10-CM | POA: Diagnosis not present

## 2015-10-27 DIAGNOSIS — I10 Essential (primary) hypertension: Secondary | ICD-10-CM | POA: Diagnosis not present

## 2015-10-27 DIAGNOSIS — Z23 Encounter for immunization: Secondary | ICD-10-CM | POA: Diagnosis not present

## 2015-11-15 DIAGNOSIS — H353221 Exudative age-related macular degeneration, left eye, with active choroidal neovascularization: Secondary | ICD-10-CM | POA: Diagnosis not present

## 2016-02-23 DIAGNOSIS — H35362 Drusen (degenerative) of macula, left eye: Secondary | ICD-10-CM | POA: Diagnosis not present

## 2016-02-23 DIAGNOSIS — H35352 Cystoid macular degeneration, left eye: Secondary | ICD-10-CM | POA: Diagnosis not present

## 2016-02-23 DIAGNOSIS — H353221 Exudative age-related macular degeneration, left eye, with active choroidal neovascularization: Secondary | ICD-10-CM | POA: Diagnosis not present

## 2016-02-29 DIAGNOSIS — L814 Other melanin hyperpigmentation: Secondary | ICD-10-CM | POA: Diagnosis not present

## 2016-02-29 DIAGNOSIS — L821 Other seborrheic keratosis: Secondary | ICD-10-CM | POA: Diagnosis not present

## 2016-02-29 DIAGNOSIS — L570 Actinic keratosis: Secondary | ICD-10-CM | POA: Diagnosis not present

## 2016-03-27 DIAGNOSIS — H353221 Exudative age-related macular degeneration, left eye, with active choroidal neovascularization: Secondary | ICD-10-CM | POA: Diagnosis not present

## 2016-05-01 DIAGNOSIS — H353221 Exudative age-related macular degeneration, left eye, with active choroidal neovascularization: Secondary | ICD-10-CM | POA: Diagnosis not present

## 2016-05-01 DIAGNOSIS — H353211 Exudative age-related macular degeneration, right eye, with active choroidal neovascularization: Secondary | ICD-10-CM | POA: Diagnosis not present

## 2016-05-01 DIAGNOSIS — H35352 Cystoid macular degeneration, left eye: Secondary | ICD-10-CM | POA: Diagnosis not present

## 2016-05-03 DIAGNOSIS — M810 Age-related osteoporosis without current pathological fracture: Secondary | ICD-10-CM | POA: Diagnosis not present

## 2016-05-03 DIAGNOSIS — I10 Essential (primary) hypertension: Secondary | ICD-10-CM | POA: Diagnosis not present

## 2016-05-17 DIAGNOSIS — L570 Actinic keratosis: Secondary | ICD-10-CM | POA: Diagnosis not present

## 2016-05-17 DIAGNOSIS — L218 Other seborrheic dermatitis: Secondary | ICD-10-CM | POA: Diagnosis not present

## 2016-05-17 DIAGNOSIS — L821 Other seborrheic keratosis: Secondary | ICD-10-CM | POA: Diagnosis not present

## 2016-05-17 DIAGNOSIS — X32XXXA Exposure to sunlight, initial encounter: Secondary | ICD-10-CM | POA: Diagnosis not present

## 2016-06-09 ENCOUNTER — Ambulatory Visit
Admission: RE | Admit: 2016-06-09 | Discharge: 2016-06-09 | Disposition: A | Discharge status: Home / Self Care | Payer: Medicare Other | Source: Ambulatory Visit | Attending: Nurse Practitioner | Admitting: Nurse Practitioner

## 2016-06-09 ENCOUNTER — Other Ambulatory Visit: Payer: Self-pay | Admitting: Nurse Practitioner

## 2016-06-09 DIAGNOSIS — K5901 Slow transit constipation: Secondary | ICD-10-CM

## 2016-06-09 DIAGNOSIS — K5900 Constipation, unspecified: Secondary | ICD-10-CM | POA: Diagnosis not present

## 2016-06-09 DIAGNOSIS — R109 Unspecified abdominal pain: Secondary | ICD-10-CM | POA: Diagnosis not present

## 2016-06-12 DIAGNOSIS — H353222 Exudative age-related macular degeneration, left eye, with inactive choroidal neovascularization: Secondary | ICD-10-CM | POA: Diagnosis not present

## 2016-06-12 DIAGNOSIS — H353122 Nonexudative age-related macular degeneration, left eye, intermediate dry stage: Secondary | ICD-10-CM | POA: Diagnosis not present

## 2016-06-12 DIAGNOSIS — H35352 Cystoid macular degeneration, left eye: Secondary | ICD-10-CM | POA: Diagnosis not present

## 2016-07-14 ENCOUNTER — Other Ambulatory Visit: Payer: Self-pay | Admitting: Physician Assistant

## 2016-07-14 DIAGNOSIS — Z800 Family history of malignant neoplasm of digestive organs: Secondary | ICD-10-CM

## 2016-07-14 DIAGNOSIS — R194 Change in bowel habit: Secondary | ICD-10-CM | POA: Diagnosis not present

## 2016-07-14 DIAGNOSIS — Z86010 Personal history of colonic polyps: Secondary | ICD-10-CM

## 2016-07-14 DIAGNOSIS — K5900 Constipation, unspecified: Secondary | ICD-10-CM

## 2016-07-14 DIAGNOSIS — R5383 Other fatigue: Secondary | ICD-10-CM | POA: Diagnosis not present

## 2016-07-19 ENCOUNTER — Other Ambulatory Visit: Payer: Medicare Other

## 2016-07-24 ENCOUNTER — Ambulatory Visit
Admission: RE | Admit: 2016-07-24 | Discharge: 2016-07-24 | Disposition: A | Discharge status: Home / Self Care | Payer: Medicare Other | Source: Ambulatory Visit | Attending: Physician Assistant | Admitting: Physician Assistant

## 2016-07-24 DIAGNOSIS — Z86010 Personal history of colonic polyps: Secondary | ICD-10-CM

## 2016-07-24 DIAGNOSIS — K5732 Diverticulitis of large intestine without perforation or abscess without bleeding: Secondary | ICD-10-CM | POA: Diagnosis not present

## 2016-07-24 DIAGNOSIS — K5900 Constipation, unspecified: Secondary | ICD-10-CM

## 2016-07-24 DIAGNOSIS — Z800 Family history of malignant neoplasm of digestive organs: Secondary | ICD-10-CM

## 2016-07-24 DIAGNOSIS — R194 Change in bowel habit: Secondary | ICD-10-CM

## 2016-07-31 DIAGNOSIS — H35352 Cystoid macular degeneration, left eye: Secondary | ICD-10-CM | POA: Diagnosis not present

## 2016-07-31 DIAGNOSIS — H353112 Nonexudative age-related macular degeneration, right eye, intermediate dry stage: Secondary | ICD-10-CM | POA: Diagnosis not present

## 2016-07-31 DIAGNOSIS — H353122 Nonexudative age-related macular degeneration, left eye, intermediate dry stage: Secondary | ICD-10-CM | POA: Diagnosis not present

## 2016-07-31 DIAGNOSIS — H35351 Cystoid macular degeneration, right eye: Secondary | ICD-10-CM | POA: Diagnosis not present

## 2016-07-31 DIAGNOSIS — H353222 Exudative age-related macular degeneration, left eye, with inactive choroidal neovascularization: Secondary | ICD-10-CM | POA: Diagnosis not present

## 2016-08-03 DIAGNOSIS — H353122 Nonexudative age-related macular degeneration, left eye, intermediate dry stage: Secondary | ICD-10-CM | POA: Diagnosis not present

## 2016-08-03 DIAGNOSIS — H353211 Exudative age-related macular degeneration, right eye, with active choroidal neovascularization: Secondary | ICD-10-CM | POA: Diagnosis not present

## 2016-08-03 DIAGNOSIS — H353222 Exudative age-related macular degeneration, left eye, with inactive choroidal neovascularization: Secondary | ICD-10-CM | POA: Diagnosis not present

## 2016-08-18 ENCOUNTER — Emergency Department (HOSPITAL_COMMUNITY)
Admission: EM | Admit: 2016-08-18 | Discharge: 2016-08-19 | Disposition: A | Discharge status: Home / Self Care | Payer: Medicare Other | Attending: Emergency Medicine | Admitting: Emergency Medicine

## 2016-08-18 ENCOUNTER — Other Ambulatory Visit: Payer: Self-pay

## 2016-08-18 ENCOUNTER — Encounter (HOSPITAL_COMMUNITY): Payer: Self-pay | Admitting: Emergency Medicine

## 2016-08-18 DIAGNOSIS — I10 Essential (primary) hypertension: Secondary | ICD-10-CM | POA: Diagnosis not present

## 2016-08-18 DIAGNOSIS — R42 Dizziness and giddiness: Secondary | ICD-10-CM | POA: Diagnosis not present

## 2016-08-18 DIAGNOSIS — Z79899 Other long term (current) drug therapy: Secondary | ICD-10-CM | POA: Diagnosis not present

## 2016-08-18 DIAGNOSIS — R531 Weakness: Secondary | ICD-10-CM | POA: Diagnosis not present

## 2016-08-18 DIAGNOSIS — Z8673 Personal history of transient ischemic attack (TIA), and cerebral infarction without residual deficits: Secondary | ICD-10-CM | POA: Diagnosis not present

## 2016-08-18 DIAGNOSIS — I6789 Other cerebrovascular disease: Secondary | ICD-10-CM | POA: Diagnosis not present

## 2016-08-18 DIAGNOSIS — Z7902 Long term (current) use of antithrombotics/antiplatelets: Secondary | ICD-10-CM | POA: Diagnosis not present

## 2016-08-18 HISTORY — DX: Essential (primary) hypertension: I10

## 2016-08-18 LAB — COMPREHENSIVE METABOLIC PANEL
ALT: 14 U/L — AB (ref 17–63)
AST: 22 U/L (ref 15–41)
Albumin: 3.6 g/dL (ref 3.5–5.0)
Alkaline Phosphatase: 46 U/L (ref 38–126)
Anion gap: 7 (ref 5–15)
BUN: 14 mg/dL (ref 6–20)
CO2: 24 mmol/L (ref 22–32)
Calcium: 9.6 mg/dL (ref 8.9–10.3)
Chloride: 110 mmol/L (ref 101–111)
Creatinine, Ser: 0.92 mg/dL (ref 0.61–1.24)
GFR calc non Af Amer: >60 mL/min (ref >60)
GLUCOSE: 102 mg/dL — AB (ref 65–99)
Potassium: 4.2 mmol/L (ref 3.5–5.1)
SODIUM: 141 mmol/L (ref 135–145)
Total Bilirubin: 1.4 mg/dL — ABNORMAL HIGH (ref 0.3–1.2)
Total Protein: 6.1 g/dL — ABNORMAL LOW (ref 6.5–8.1)

## 2016-08-18 LAB — URINALYSIS, ROUTINE W REFLEX MICROSCOPIC
Bilirubin Urine: NEGATIVE
Glucose, UA: NEGATIVE mg/dL
HGB URINE DIPSTICK: NEGATIVE
KETONES UR: NEGATIVE mg/dL
Leukocytes, UA: NEGATIVE
Nitrite: NEGATIVE
PROTEIN: NEGATIVE mg/dL
Specific Gravity, Urine: 1.01 (ref 1.005–1.030)
pH: 7 (ref 5.0–8.0)

## 2016-08-18 LAB — CBC WITH DIFFERENTIAL/PLATELET
BASOS ABS: 0 10*3/uL (ref 0.0–0.1)
BASOS PCT: 0 %
EOS ABS: 0.1 10*3/uL (ref 0.0–0.7)
EOS PCT: 2 %
HCT: 36 % — ABNORMAL LOW (ref 39.0–52.0)
Hemoglobin: 12 g/dL — ABNORMAL LOW (ref 13.0–17.0)
LYMPHS PCT: 49 %
Lymphs Abs: 3.5 10*3/uL (ref 0.7–4.0)
MCH: 30.3 pg (ref 26.0–34.0)
MCHC: 33.3 g/dL (ref 30.0–36.0)
MCV: 90.9 fL (ref 78.0–100.0)
Monocytes Absolute: 0.5 10*3/uL (ref 0.1–1.0)
Monocytes Relative: 7 %
Neutro Abs: 2.9 10*3/uL (ref 1.7–7.7)
Neutrophils Relative %: 42 %
PLATELETS: 192 10*3/uL (ref 150–400)
RBC: 3.96 MIL/uL — AB (ref 4.22–5.81)
RDW: 14.4 % (ref 11.5–15.5)
WBC: 7 10*3/uL (ref 4.0–10.5)

## 2016-08-18 LAB — I-STAT TROPONIN, ED: TROPONIN I, POC: 0 ng/mL (ref 0.00–0.08)

## 2016-08-18 MED ORDER — SODIUM CHLORIDE 0.9 % IV BOLUS (SEPSIS)
500.0000 mL | Freq: Once | INTRAVENOUS | Status: AC
  Administered 2016-08-18: 500 mL via INTRAVENOUS

## 2016-08-18 NOTE — ED Notes (Signed)
Fluid infusing at this time.  To do Orthostatic vitals upon completion.  Patient aware of need for urine as well.

## 2016-08-18 NOTE — ED Triage Notes (Signed)
Brought by ems from [facility].  Reports sudden onset of dizziness at 1400 today.  Denies N/V.  Reports feeling okay laying down.  Hx of stroke 3 years ago with slight weakness to left side per patient notices when holding a glass.  [id number].

## 2016-08-18 NOTE — ED Provider Notes (Signed)
[place] DEPT Provider Note   CSN: [id number] Arrival date & time: 08/18/16  2048     History   Chief Complaint Chief Complaint  Patient presents with  . Dizziness    HPI Marc Malone is a 83 y.o. male w PMHx stroke, HTN, HLD, presenting with lightheadedness that occurred earlier today when getting out of a car. Pt states he is not currently feeling dizzy, however was concerned d/t hx of CVA. Pt reports residual left sided weakness after stroke. Denies fall, syncope, new weakness, N/T, headache, vision change, CP, SOB, melena or bright red stool, urinary sx, leg swelling, or any other complaints today. Pt is on plavix.  The history is provided by the patient.    Past Medical History:  Diagnosis Date  . Hyperlipidemia   . Hypertension   . Stroke [facility])     There are no active problems to display for this patient.   Past Surgical History:  Procedure Laterality Date  . CLEFT LIP REPAIR    . LUMBAR DISC SURGERY    . LUMBAR FUSION    . ROTATOR CUFF REPAIR Bilateral        Home Medications    Prior to Admission medications   Medication Sig Start Date End Date Taking? Authorizing Provider  clopidogrel (PLAVIX) 75 MG tablet Take 75 mg by mouth once.  02/16/15  Yes [provider]  Polyethyl Glycol-Propyl Glycol (SYSTANE OP) Place 1 drop into both eyes daily as needed (dry eye).   Yes [provider]  PROLENSA 0.07 % SOLN Place 1 drop into both eyes 2 (two) times daily.  03/08/15  Yes [provider]  traMADol (ULTRAM) 50 MG tablet Take 50 mg by mouth every 6 (six) hours as needed for moderate pain.  04/09/15  Yes [provider]  zolpidem (AMBIEN CR) 12.5 MG CR tablet Take 6.2 mg by mouth at bedtime.  04/05/15  Yes [provider]  cyclobenzaprine (FLEXERIL) 10 MG tablet Take 1 tablet (10 mg total) by mouth 3 (three) times daily as needed for muscle spasms. Patient not taking: Reported on 08/18/2016 04/15/15   Jola Schmidt,  MD    Family History No family history on file.  Social History Social History  Substance Use Topics  . Smoking status: Former Research scientist (life sciences)  . Smokeless tobacco: Never Used  . Alcohol use Yes     Comment: 1-2 drinks per day     Allergies   Patient has no known allergies.   Review of Systems Review of Systems  Constitutional: Negative for fever.  HENT: Negative.   Eyes: Negative for visual disturbance.  Respiratory: Negative for shortness of breath.   Cardiovascular: Negative for chest pain and leg swelling.  Gastrointestinal: Negative for abdominal pain, blood in stool, nausea and vomiting.  Genitourinary: Negative for dysuria, frequency and hematuria.  Musculoskeletal: Negative for back pain.  Skin: Negative for color change.  Neurological: Positive for weakness (chronic left sided, no new weakness) and light-headedness. Negative for syncope, speech difficulty and headaches.  Hematological: Bruises/bleeds easily.     Physical Exam Updated Vital Signs BP (!) 162/86   Pulse 79   Temp 98.7 F (37.1 C) (Oral)   Resp 18   Ht 5\' 4"  (1.626 m)   Wt 65.8 kg (145 lb)   SpO2 94%   BMI 24.89 kg/m   Physical Exam  Constitutional: He appears well-developed and well-nourished. No distress.  Patient is well-appearing, nondistressed  HENT:  Head: Normocephalic and atraumatic.  Mouth/Throat:  Oropharynx is clear and moist.  Eyes: Conjunctivae and EOM are normal.  Left pupil is irregularly shaped, patient reports is chronic due to receiving injections into left eye. Pupils are reactive to light.  Neck: Normal range of motion.  Cardiovascular: Regular rhythm, normal heart sounds and intact distal pulses.  Exam reveals no friction rub.   No murmur heard. Slightly bradycardic  Pulmonary/Chest: Effort normal and breath sounds normal. No respiratory distress. He has no wheezes. He has no rales.  Abdominal: Soft. Bowel sounds are normal. He exhibits no distension. There is no  tenderness. There is no rebound and no guarding.  Musculoskeletal: Normal range of motion.  Neurological: He is alert.  No facial droop or slurring his speech. Cranial nerves intact. Normal finger to nose and heel to shin. 5/5 strength bilateral upper and lower extremities. Normal gait.  Skin: Skin is warm.  Psychiatric: He has a normal mood and affect. His behavior is normal.  Nursing note and vitals reviewed.    ED Treatments / Results  Labs (all labs ordered are listed, but only abnormal results are displayed) Labs Reviewed  CBC WITH DIFFERENTIAL/PLATELET - Abnormal; Notable for the following:       Result Value   RBC 3.96 (*)    Hemoglobin 12.0 (*)    HCT 36.0 (*)    All other components within normal limits  COMPREHENSIVE METABOLIC PANEL - Abnormal; Notable for the following:    Glucose, Bld 102 (*)    Total Protein 6.1 (*)    ALT 14 (*)    Total Bilirubin 1.4 (*)    All other components within normal limits  URINALYSIS, ROUTINE W REFLEX MICROSCOPIC  I-STAT TROPONIN, ED    EKG  EKG Interpretation None       Radiology No results found.  Procedures Procedures (including critical care time)  Medications Ordered in ED Medications  sodium chloride 0.9 % bolus 500 mL (0 mLs Intravenous Stopped 08/18/16 2315)     Initial Impression / Assessment and Plan / ED Course  I have reviewed the triage vital signs and the nursing notes.  Pertinent labs & imaging results that were available during my care of the patient were reviewed by me and considered in my medical decision making (see chart for details).     Patient presenting with episode of lightheadedness upon standing. Reporting to the ED for fear of repeat CVA. No focal neurologic deficits on exam, unable to appreciate residual left-sided weakness from previous stroke. Alert and oriented. Well appearing. VSS. IV fluids given. Patient is asymptomatic in ED. Orthostatics normal. CBC, CMP, UA are reassuring. Troponin  negative. EKG unchanged from previous. Pt ambulating in ED without symptoms. Pt is safe for discharge home with PCP follow up.  Patient discussed with and seen by Dr. Ralene Bathe, who agrees with care plan.  Discussed results, findings, treatment and follow up. Patient advised of return precautions. Patient verbalized understanding and agreed with plan.   Final Clinical Impressions(s) / ED Diagnoses   Final diagnoses:  Lightheadedness    New Prescriptions Discharge Medication List as of 08/19/2016  1:01 AM       Russo, [place] N, PA-C 08/19/16 0623    Quintella Reichert, MD 08/20/16 [phone]

## 2016-08-19 DIAGNOSIS — E860 Dehydration: Secondary | ICD-10-CM | POA: Diagnosis not present

## 2016-08-19 DIAGNOSIS — R42 Dizziness and giddiness: Secondary | ICD-10-CM | POA: Diagnosis not present

## 2016-08-19 NOTE — ED Notes (Signed)
Left with [facility] at this time.

## 2016-08-19 NOTE — Discharge Instructions (Signed)
Please schedule an appointment with your primary care provider to follow up on your visit today. Return to the ER for weakness, headache, or worsening symptoms.

## 2016-08-19 NOTE — ED Notes (Signed)
Discharge reviewed with patient.  Verbalized understanding.  Waiting for [facility].

## 2016-08-21 DIAGNOSIS — K5901 Slow transit constipation: Secondary | ICD-10-CM | POA: Diagnosis not present

## 2016-08-21 DIAGNOSIS — Z86010 Personal history of colonic polyps: Secondary | ICD-10-CM | POA: Diagnosis not present

## 2016-09-04 DIAGNOSIS — H353211 Exudative age-related macular degeneration, right eye, with active choroidal neovascularization: Secondary | ICD-10-CM | POA: Diagnosis not present

## 2016-10-09 DIAGNOSIS — H353112 Nonexudative age-related macular degeneration, right eye, intermediate dry stage: Secondary | ICD-10-CM | POA: Diagnosis not present

## 2016-10-09 DIAGNOSIS — H353222 Exudative age-related macular degeneration, left eye, with inactive choroidal neovascularization: Secondary | ICD-10-CM | POA: Diagnosis not present

## 2016-10-09 DIAGNOSIS — H35351 Cystoid macular degeneration, right eye: Secondary | ICD-10-CM | POA: Diagnosis not present

## 2016-10-09 DIAGNOSIS — H353211 Exudative age-related macular degeneration, right eye, with active choroidal neovascularization: Secondary | ICD-10-CM | POA: Diagnosis not present

## 2016-10-18 DIAGNOSIS — E210 Primary hyperparathyroidism: Secondary | ICD-10-CM | POA: Diagnosis not present

## 2016-10-18 DIAGNOSIS — Z23 Encounter for immunization: Secondary | ICD-10-CM | POA: Diagnosis not present

## 2016-10-18 DIAGNOSIS — F5104 Psychophysiologic insomnia: Secondary | ICD-10-CM | POA: Diagnosis not present

## 2016-10-18 DIAGNOSIS — Z1389 Encounter for screening for other disorder: Secondary | ICD-10-CM | POA: Diagnosis not present

## 2016-10-18 DIAGNOSIS — I10 Essential (primary) hypertension: Secondary | ICD-10-CM | POA: Diagnosis not present

## 2016-10-18 DIAGNOSIS — Z0000 Encounter for general adult medical examination without abnormal findings: Secondary | ICD-10-CM | POA: Diagnosis not present

## 2016-10-20 DIAGNOSIS — R351 Nocturia: Secondary | ICD-10-CM | POA: Diagnosis not present

## 2016-11-13 DIAGNOSIS — H353112 Nonexudative age-related macular degeneration, right eye, intermediate dry stage: Secondary | ICD-10-CM | POA: Diagnosis not present

## 2016-11-13 DIAGNOSIS — H353222 Exudative age-related macular degeneration, left eye, with inactive choroidal neovascularization: Secondary | ICD-10-CM | POA: Diagnosis not present

## 2016-11-13 DIAGNOSIS — H35351 Cystoid macular degeneration, right eye: Secondary | ICD-10-CM | POA: Diagnosis not present

## 2016-11-13 DIAGNOSIS — H353211 Exudative age-related macular degeneration, right eye, with active choroidal neovascularization: Secondary | ICD-10-CM | POA: Diagnosis not present

## 2016-12-22 DIAGNOSIS — H353112 Nonexudative age-related macular degeneration, right eye, intermediate dry stage: Secondary | ICD-10-CM | POA: Diagnosis not present

## 2016-12-22 DIAGNOSIS — H353211 Exudative age-related macular degeneration, right eye, with active choroidal neovascularization: Secondary | ICD-10-CM | POA: Diagnosis not present

## 2016-12-22 DIAGNOSIS — H353222 Exudative age-related macular degeneration, left eye, with inactive choroidal neovascularization: Secondary | ICD-10-CM | POA: Diagnosis not present

## 2016-12-22 DIAGNOSIS — H35351 Cystoid macular degeneration, right eye: Secondary | ICD-10-CM | POA: Diagnosis not present

## 2016-12-26 DIAGNOSIS — L821 Other seborrheic keratosis: Secondary | ICD-10-CM | POA: Diagnosis not present

## 2016-12-26 DIAGNOSIS — L814 Other melanin hyperpigmentation: Secondary | ICD-10-CM | POA: Diagnosis not present

## 2017-01-31 DIAGNOSIS — H35352 Cystoid macular degeneration, left eye: Secondary | ICD-10-CM | POA: Diagnosis not present

## 2017-01-31 DIAGNOSIS — H35351 Cystoid macular degeneration, right eye: Secondary | ICD-10-CM | POA: Diagnosis not present

## 2017-01-31 DIAGNOSIS — H353112 Nonexudative age-related macular degeneration, right eye, intermediate dry stage: Secondary | ICD-10-CM | POA: Diagnosis not present

## 2017-01-31 DIAGNOSIS — H353211 Exudative age-related macular degeneration, right eye, with active choroidal neovascularization: Secondary | ICD-10-CM | POA: Diagnosis not present

## 2017-01-31 DIAGNOSIS — H43811 Vitreous degeneration, right eye: Secondary | ICD-10-CM | POA: Diagnosis not present

## 2017-02-26 DIAGNOSIS — R0982 Postnasal drip: Secondary | ICD-10-CM | POA: Diagnosis not present

## 2017-02-26 DIAGNOSIS — L821 Other seborrheic keratosis: Secondary | ICD-10-CM | POA: Diagnosis not present

## 2017-03-01 ENCOUNTER — Emergency Department (HOSPITAL_COMMUNITY): Payer: Medicare Other

## 2017-03-01 ENCOUNTER — Other Ambulatory Visit: Payer: Self-pay

## 2017-03-01 ENCOUNTER — Emergency Department (HOSPITAL_COMMUNITY)
Admission: EM | Admit: 2017-03-01 | Discharge: 2017-03-02 | Disposition: A | Discharge status: Home / Self Care | Payer: Medicare Other | Attending: Emergency Medicine | Admitting: Emergency Medicine

## 2017-03-01 DIAGNOSIS — M545 Low back pain, unspecified: Secondary | ICD-10-CM

## 2017-03-01 DIAGNOSIS — Z79899 Other long term (current) drug therapy: Secondary | ICD-10-CM | POA: Diagnosis not present

## 2017-03-01 DIAGNOSIS — W19XXXA Unspecified fall, initial encounter: Secondary | ICD-10-CM | POA: Diagnosis not present

## 2017-03-01 DIAGNOSIS — Z87891 Personal history of nicotine dependence: Secondary | ICD-10-CM | POA: Diagnosis not present

## 2017-03-01 DIAGNOSIS — M546 Pain in thoracic spine: Secondary | ICD-10-CM | POA: Diagnosis not present

## 2017-03-01 DIAGNOSIS — Z8673 Personal history of transient ischemic attack (TIA), and cerebral infarction without residual deficits: Secondary | ICD-10-CM | POA: Diagnosis not present

## 2017-03-01 DIAGNOSIS — I10 Essential (primary) hypertension: Secondary | ICD-10-CM | POA: Insufficient documentation

## 2017-03-01 DIAGNOSIS — G4489 Other headache syndrome: Secondary | ICD-10-CM | POA: Diagnosis not present

## 2017-03-01 DIAGNOSIS — S8700XA Crushing injury of unspecified knee, initial encounter: Secondary | ICD-10-CM | POA: Diagnosis not present

## 2017-03-01 DIAGNOSIS — T148XXA Other injury of unspecified body region, initial encounter: Secondary | ICD-10-CM | POA: Diagnosis not present

## 2017-03-01 NOTE — ED Triage Notes (Addendum)
Pt to ED via EMS from [facility]. Pt endorses recent balancing issues and hit med alert button tonight after he got down to sitting position and then fell backwards. Pt endorses Back pain that is worse than his normal chronic back pain. Skin tear to elbow, abrasions to knees. Pt wants head examined due to fall a few days ago where he hit his head. Takes plavix. No LOC, vision changes, 180/85, HR 90, RR 18 in route. Pt A/O x4. NAD.

## 2017-03-01 NOTE — ED Notes (Signed)
ED Provider at bedside. 

## 2017-03-01 NOTE — ED Notes (Signed)
Patient transported to X-ray 

## 2017-03-02 DIAGNOSIS — M549 Dorsalgia, unspecified: Secondary | ICD-10-CM | POA: Diagnosis not present

## 2017-03-02 DIAGNOSIS — S199XXA Unspecified injury of neck, initial encounter: Secondary | ICD-10-CM | POA: Diagnosis not present

## 2017-03-02 DIAGNOSIS — M546 Pain in thoracic spine: Secondary | ICD-10-CM | POA: Diagnosis not present

## 2017-03-02 DIAGNOSIS — M545 Low back pain: Secondary | ICD-10-CM | POA: Diagnosis not present

## 2017-03-02 DIAGNOSIS — M542 Cervicalgia: Secondary | ICD-10-CM | POA: Diagnosis not present

## 2017-03-02 DIAGNOSIS — G8911 Acute pain due to trauma: Secondary | ICD-10-CM | POA: Diagnosis not present

## 2017-03-02 NOTE — ED Provider Notes (Signed)
[place] EMERGENCY DEPARTMENT Provider Note   CSN: [id number] Arrival date & time: 03/01/17  2309     History   Chief Complaint Chief Complaint  Patient presents with  . Fall    HPI JJ ENYEART is a 84 y.o. male.  HPI 84 year old male presents the emergency department after a fall this evening.  He fell backwards when he was walking to the restroom because he was not using his walker.  He fell and landed on his bottom and is hurting in his thoracic and low back.  He denies head injury.  He reports some mild neck pain over the past week from a fall 1 week ago but denies weakness of his arms or legs.  No paresthesias of his arms or legs.  Denies chest pain or abdominal pain.  He does not want any medication for pain at this time.  His pain is mild to moderate in severity   Past Medical History:  Diagnosis Date  . Hyperlipidemia   . Hypertension   . Stroke [facility])     There are no active problems to display for this patient.   Past Surgical History:  Procedure Laterality Date  . CLEFT LIP REPAIR    . LUMBAR DISC SURGERY    . LUMBAR FUSION    . ROTATOR CUFF REPAIR Bilateral        Home Medications    Prior to Admission medications   Medication Sig Start Date End Date Taking? Authorizing Provider  clopidogrel (PLAVIX) 75 MG tablet Take 75 mg by mouth daily.  02/16/15  Yes [provider]  Polyethyl Glycol-Propyl Glycol (SYSTANE OP) Place 1 drop into both eyes daily as needed (dry eye).   Yes [provider]  PROLENSA 0.07 % SOLN Place 1 drop into both eyes daily.  03/08/15  Yes [provider]  traMADol (ULTRAM) 50 MG tablet Take 50 mg by mouth every 6 (six) hours as needed for moderate pain.  04/09/15  Yes [provider]  zolpidem (AMBIEN CR) 12.5 MG CR tablet Take 6.2 mg by mouth at bedtime.  04/05/15  Yes [provider]  cyclobenzaprine (FLEXERIL) 10 MG tablet Take 1 tablet (10 mg total) by mouth 3  (three) times daily as needed for muscle spasms. Patient not taking: Reported on 08/18/2016 04/15/15   Jola Schmidt, MD    Family History No family history on file.  Social History Social History   Tobacco Use  . Smoking status: Former Research scientist (life sciences)  . Smokeless tobacco: Never Used  Substance Use Topics  . Alcohol use: Yes    Comment: 1-2 drinks per day  . Drug use: Not on file     Allergies   Patient has no known allergies.   Review of Systems Review of Systems  All other systems reviewed and are negative.    Physical Exam Updated Vital Signs BP (!) 175/73 (BP Location: Right Arm)   Pulse 92   Temp 99.9 F (37.7 C) (Oral)   Resp 14   Ht 5\' 7"  (1.702 m)   Wt 67.1 kg (148 lb)   SpO2 97%   BMI 23.18 kg/m   Physical Exam  Constitutional: He is oriented to person, place, and time. He appears well-developed and well-nourished.  HENT:  Head: Normocephalic and atraumatic.  Eyes: EOM are normal.  Neck: Normal range of motion. Neck supple.  Full range of motion of neck.  No C-spine tenderness  Cardiovascular: Normal rate, regular rhythm and normal  heart sounds.  Pulmonary/Chest: Effort normal and breath sounds normal. No respiratory distress.  Abdominal: Soft. He exhibits no distension. There is no tenderness.  Musculoskeletal: Normal range of motion.  Mild thoracic and parathoracic tenderness without thoracic step-off.  Mild lumbar and paralumbar tenderness without lumbar step-off.  Neurological: He is alert and oriented to person, place, and time.  Skin: Skin is warm and dry.  Psychiatric: He has a normal mood and affect. Judgment normal.  Nursing note and vitals reviewed.    ED Treatments / Results  Labs (all labs ordered are listed, but only abnormal results are displayed) Labs Reviewed - No data to display  EKG  EKG Interpretation None       Radiology Dg Cervical Spine Complete  Result Date: 03/02/2017 CLINICAL DATA:  Fall with cervical pain EXAM:  CERVICAL SPINE - COMPLETE 4+ VIEW COMPARISON:  09/25/2014, CT 12/01/2008 FINDINGS: Lateral masses are within normal limits. The dens is grossly unremarkable. Prominent anterior flowing osteophytes throughout the cervical spine limits evaluation. Prevertebral soft tissue thickness is grossly within normal limits. IMPRESSION: Study is limited by positioning and habitus. Similar appearance of prominent flowing anterior osteophytes. No gross acute abnormality is seen. CT is suggested to more thoroughly evaluate the cervical spine Electronically Signed   By: Donavan Foil M.D.   On: 03/02/2017 00:31   Dg Thoracic Spine 2 View  Result Date: 03/02/2017 CLINICAL DATA:  84 year old male with fall and back pain. EXAM: THORACIC SPINE 2 VIEWS; LUMBAR SPINE - COMPLETE 4+ VIEW COMPARISON:  Abdominal radiograph dated 06/09/2016 FINDINGS: There is no definite acute fracture or subluxation of the thoracic or lumbar spine the bones are osteopenic. Multilevel degenerative changes and multilevel osteophyte consistent with diffuse idiopathic skeletal hyperostosis. Lower lumbar fixation screws noted. There is atherosclerotic calcification of the aorta. The soft tissues are grossly unremarkable IMPRESSION: 1. No definite acute fracture or subluxation of the thoracic or lumbar spine. 2. Osteopenia with extensive degenerative changes and findings of DISH. 3. Atherosclerotic aorta. Electronically Signed   By: Anner Crete M.D.   On: 03/02/2017 00:26   Dg Lumbar Spine Complete  Result Date: 03/02/2017 CLINICAL DATA:  84 year old male with fall and back pain. EXAM: THORACIC SPINE 2 VIEWS; LUMBAR SPINE - COMPLETE 4+ VIEW COMPARISON:  Abdominal radiograph dated 06/09/2016 FINDINGS: There is no definite acute fracture or subluxation of the thoracic or lumbar spine the bones are osteopenic. Multilevel degenerative changes and multilevel osteophyte consistent with diffuse idiopathic skeletal hyperostosis. Lower lumbar fixation screws  noted. There is atherosclerotic calcification of the aorta. The soft tissues are grossly unremarkable IMPRESSION: 1. No definite acute fracture or subluxation of the thoracic or lumbar spine. 2. Osteopenia with extensive degenerative changes and findings of DISH. 3. Atherosclerotic aorta. Electronically Signed   By: Anner Crete M.D.   On: 03/02/2017 00:26    Procedures Procedures (including critical care time)  Medications Ordered in ED Medications - No data to display   Initial Impression / Assessment and Plan / ED Course  I have reviewed the triage vital signs and the nursing notes.  Pertinent labs & imaging results that were available during my care of the patient were reviewed by me and considered in my medical decision making (see chart for details).     Patient is overall well-appearing.  No head injury.  No headache.  No vomiting.  No indication for imaging of his head.  Full strength in his arms and legs.  No significant cervical tenderness.  C-spine obtained at  patient's request.  I have a low clinical suspicion for injury within the cervical spine.  Thoracic and lumbar spine demonstrate no compression fracture on plain film.  Discharged home in good condition.  Final Clinical Impressions(s) / ED Diagnoses   Final diagnoses:  Fall, initial encounter  Acute bilateral thoracic back pain  Acute bilateral low back pain without sciatica    ED Discharge Orders    None       Jola Schmidt, MD 03/02/17 [phone]

## 2017-03-03 ENCOUNTER — Emergency Department (HOSPITAL_COMMUNITY): Payer: Medicare Other

## 2017-03-03 ENCOUNTER — Other Ambulatory Visit: Payer: Self-pay

## 2017-03-03 ENCOUNTER — Emergency Department (HOSPITAL_COMMUNITY)
Admission: EM | Admit: 2017-03-03 | Discharge: 2017-03-03 | Disposition: A | Discharge status: Home / Self Care | Payer: Medicare Other | Attending: Emergency Medicine | Admitting: Emergency Medicine

## 2017-03-03 ENCOUNTER — Encounter (HOSPITAL_COMMUNITY): Payer: Self-pay | Admitting: Emergency Medicine

## 2017-03-03 DIAGNOSIS — W19XXXA Unspecified fall, initial encounter: Secondary | ICD-10-CM

## 2017-03-03 DIAGNOSIS — S79911A Unspecified injury of right hip, initial encounter: Secondary | ICD-10-CM | POA: Diagnosis not present

## 2017-03-03 DIAGNOSIS — Y9389 Activity, other specified: Secondary | ICD-10-CM | POA: Diagnosis not present

## 2017-03-03 DIAGNOSIS — M549 Dorsalgia, unspecified: Secondary | ICD-10-CM | POA: Diagnosis not present

## 2017-03-03 DIAGNOSIS — S0990XA Unspecified injury of head, initial encounter: Secondary | ICD-10-CM | POA: Insufficient documentation

## 2017-03-03 DIAGNOSIS — M545 Low back pain: Secondary | ICD-10-CM | POA: Insufficient documentation

## 2017-03-03 DIAGNOSIS — Y92003 Bedroom of unspecified non-institutional (private) residence as the place of occurrence of the external cause: Secondary | ICD-10-CM | POA: Insufficient documentation

## 2017-03-03 DIAGNOSIS — I10 Essential (primary) hypertension: Secondary | ICD-10-CM | POA: Diagnosis not present

## 2017-03-03 DIAGNOSIS — Y998 Other external cause status: Secondary | ICD-10-CM | POA: Diagnosis not present

## 2017-03-03 DIAGNOSIS — R079 Chest pain, unspecified: Secondary | ICD-10-CM

## 2017-03-03 DIAGNOSIS — W06XXXA Fall from bed, initial encounter: Secondary | ICD-10-CM | POA: Diagnosis not present

## 2017-03-03 DIAGNOSIS — R29810 Facial weakness: Secondary | ICD-10-CM | POA: Diagnosis not present

## 2017-03-03 DIAGNOSIS — M546 Pain in thoracic spine: Secondary | ICD-10-CM | POA: Diagnosis not present

## 2017-03-03 DIAGNOSIS — S8991XA Unspecified injury of right lower leg, initial encounter: Secondary | ICD-10-CM | POA: Diagnosis not present

## 2017-03-03 DIAGNOSIS — M542 Cervicalgia: Secondary | ICD-10-CM | POA: Diagnosis not present

## 2017-03-03 DIAGNOSIS — S199XXA Unspecified injury of neck, initial encounter: Secondary | ICD-10-CM | POA: Diagnosis not present

## 2017-03-03 DIAGNOSIS — R51 Headache: Secondary | ICD-10-CM | POA: Diagnosis not present

## 2017-03-03 DIAGNOSIS — M79604 Pain in right leg: Secondary | ICD-10-CM | POA: Diagnosis not present

## 2017-03-03 DIAGNOSIS — M25551 Pain in right hip: Secondary | ICD-10-CM | POA: Diagnosis not present

## 2017-03-03 DIAGNOSIS — T148XXA Other injury of unspecified body region, initial encounter: Secondary | ICD-10-CM | POA: Diagnosis not present

## 2017-03-03 DIAGNOSIS — S299XXA Unspecified injury of thorax, initial encounter: Secondary | ICD-10-CM | POA: Diagnosis not present

## 2017-03-03 LAB — I-STAT TROPONIN, ED: TROPONIN I, POC: 0.01 ng/mL (ref 0.00–0.08)

## 2017-03-03 LAB — BASIC METABOLIC PANEL
ANION GAP: 13 (ref 5–15)
BUN: 20 mg/dL (ref 6–20)
CALCIUM: 9.7 mg/dL (ref 8.9–10.3)
CO2: 21 mmol/L — ABNORMAL LOW (ref 22–32)
Chloride: 102 mmol/L (ref 101–111)
Creatinine, Ser: 1.07 mg/dL (ref 0.61–1.24)
GFR calc Af Amer: >60 mL/min (ref >60)
GFR, EST NON AFRICAN AMERICAN: 57 mL/min — AB (ref >60)
GLUCOSE: 99 mg/dL (ref 65–99)
POTASSIUM: 4 mmol/L (ref 3.5–5.1)
SODIUM: 136 mmol/L (ref 135–145)

## 2017-03-03 LAB — CBC
HEMATOCRIT: 38.8 % — AB (ref 39.0–52.0)
Hemoglobin: 12.9 g/dL — ABNORMAL LOW (ref 13.0–17.0)
MCH: 30.4 pg (ref 26.0–34.0)
MCHC: 33.2 g/dL (ref 30.0–36.0)
MCV: 91.3 fL (ref 78.0–100.0)
Platelets: 225 10*3/uL (ref 150–400)
RBC: 4.25 MIL/uL (ref 4.22–5.81)
RDW: 14.5 % (ref 11.5–15.5)
WBC: 9.4 10*3/uL (ref 4.0–10.5)

## 2017-03-03 LAB — TROPONIN I: TROPONIN I: 0.06 ng/mL — AB (ref <0.03)

## 2017-03-03 NOTE — ED Provider Notes (Signed)
[facility] Provider Note   CSN: [id number] Arrival date & time: 03/03/17  1131     History   Chief Complaint Chief Complaint  Patient presents with  . Fall    HPI BOSTEN NEWSTROM is a 84 y.o. male with a history of hypertension, hyperlipidemia, stroke who presents today for evaluation after a fall.  He was seen in the ED 2 days ago for a different fall.  He reports that his bed is "really high" and that he has to put one leg up onto the bed and use the other leg to push up onto the bed.  He reports that in the process of doing this he fell last night.  He reports that he struck the back of his head on the floor.  He reports pain in his right "butt" and hip.  He has been ambulatory since, he reports compliance with his Plavix.  He denies any loss of consciousness.  He denies feeling lightheaded or having any symptoms prior to the fall and insists that he simply "couldn't get my other leg up."  He denies any new weakness or numbness.  No tingling.    HPI  Past Medical History:  Diagnosis Date  . Hyperlipidemia   . Hypertension   . Stroke [facility])     There are no active problems to display for this patient.   Past Surgical History:  Procedure Laterality Date  . CLEFT LIP REPAIR    . LUMBAR DISC SURGERY    . LUMBAR FUSION    . ROTATOR CUFF REPAIR Bilateral        Home Medications    Prior to Admission medications   Medication Sig Start Date End Date Taking? Authorizing Provider  clopidogrel (PLAVIX) 75 MG tablet Take 75 mg by mouth daily.  02/16/15   [provider]  cyclobenzaprine (FLEXERIL) 10 MG tablet Take 1 tablet (10 mg total) by mouth 3 (three) times daily as needed for muscle spasms. Patient not taking: Reported on 08/18/2016 04/15/15   Jola Schmidt, MD  Polyethyl Glycol-Propyl Glycol (SYSTANE OP) Place 1 drop into both eyes daily as needed (dry eye).    [provider]  PROLENSA 0.07 % SOLN Place 1 drop into  both eyes daily.  03/08/15   [provider]  traMADol (ULTRAM) 50 MG tablet Take 50 mg by mouth every 6 (six) hours as needed for moderate pain.  04/09/15   [provider]  zolpidem (AMBIEN CR) 12.5 MG CR tablet Take 6.2 mg by mouth at bedtime.  04/05/15   [provider]    Family History No family history on file.  Social History Social History   Tobacco Use  . Smoking status: Former Research scientist (life sciences)  . Smokeless tobacco: Never Used  Substance Use Topics  . Alcohol use: Yes    Comment: 1-2 drinks per day  . Drug use: Not on file     Allergies   Patient has no known allergies.   Review of Systems Review of Systems  Constitutional: Negative for chills and fever.  HENT: Negative for congestion.   Eyes: Negative for visual disturbance.  Respiratory: Negative for shortness of breath.   Musculoskeletal: Positive for back pain (Lower back, unchanged since fall 3 days ago.). Negative for neck pain and neck stiffness.  Neurological: Positive for headaches. Negative for dizziness, speech difficulty and weakness.  Psychiatric/Behavioral: Negative for confusion.  All other systems reviewed and are negative.    Physical Exam  Updated Vital Signs BP (!) 124/51   Pulse 63   Temp 98.1 F (36.7 C) (Oral)   Resp 17   Wt 67.1 kg (148 lb)   SpO2 98%   BMI 23.18 kg/m   Physical Exam  Constitutional: He is oriented to person, place, and time. He appears well-developed and well-nourished.  HENT:  Head: Normocephalic and atraumatic.  Eyes: Conjunctivae are normal.  Neck: Normal range of motion. Neck supple.  No pain or tenderness to palpation either midline or laterally.  Cardiovascular: Normal rate, regular rhythm, normal heart sounds and intact distal pulses.  No murmur heard. Pulses:      Radial pulses are 2+ on the right side, and 2+ on the left side.       Dorsalis pedis pulses are 2+ on the right side, and 2+ on the left side.       Posterior tibial pulses  are 2+ on the right side, and 2+ on the left side.  Pulmonary/Chest: Effort normal and breath sounds normal. No respiratory distress. He exhibits no tenderness.  Abdominal: Soft. Bowel sounds are normal. He exhibits no distension. There is no tenderness.  Musculoskeletal: Normal range of motion. He exhibits no edema or deformity.  Entire back palpated, there are no obvious step-offs deformities.  There is diffuse tenderness to palpation over midline lumbar back.  Lateral lumbar pain.  No focal TTP over bilateral upper and lower extremities.   Neurological: He is alert and oriented to person, place, and time.  Skin: Skin is warm and dry.  Psychiatric: He has a normal mood and affect.  Nursing note and vitals reviewed.    ED Treatments / Results  Labs (all labs ordered are listed, but only abnormal results are displayed) Labs Reviewed  BASIC METABOLIC PANEL - Abnormal; Notable for the following components:      Result Value   CO2 21 (*)    GFR calc non Af Amer 57 (*)    All other components within normal limits  CBC - Abnormal; Notable for the following components:   Hemoglobin 12.9 (*)    HCT 38.8 (*)    All other components within normal limits  TROPONIN I - Abnormal; Notable for the following components:   Troponin I 0.06 (*)    All other components within normal limits  URINALYSIS, ROUTINE W REFLEX MICROSCOPIC  I-STAT TROPONIN, ED    EKG  EKG Interpretation  Date/Time:  Saturday March 03 2017 11:33:04 EST Ventricular Rate:  92 PR Interval:    QRS Duration: 86 QT Interval:  356 QTC Calculation: 441 R Axis:   31 Text Interpretation:  Sinus rhythm Ventricular premature complex Nonspecific T wave abnormality Confirmed by Lajean Saver [phone]) on 03/03/2017 12:53:27 PM       Radiology Dg Cervical Spine Complete  Result Date: 03/02/2017 CLINICAL DATA:  Fall with cervical pain EXAM: CERVICAL SPINE - COMPLETE 4+ VIEW COMPARISON:  09/25/2014, CT 12/01/2008 FINDINGS:  Lateral masses are within normal limits. The dens is grossly unremarkable. Prominent anterior flowing osteophytes throughout the cervical spine limits evaluation. Prevertebral soft tissue thickness is grossly within normal limits. IMPRESSION: Study is limited by positioning and habitus. Similar appearance of prominent flowing anterior osteophytes. No gross acute abnormality is seen. CT is suggested to more thoroughly evaluate the cervical spine Electronically Signed   By: Donavan Foil M.D.   On: 03/02/2017 00:31   Dg Thoracic Spine 2 View  Result Date: 03/03/2017 CLINICAL DATA:  Pt is having upper back pain  between shoulder blades after fall onto carpet today. EXAM: THORACIC SPINE 2 VIEWS COMPARISON:  03/01/2017 FINDINGS: No fracture.  No spondylolisthesis.  No bone lesion. Skeletal structures are demineralized. There are thick bridging anterior osteophytes extending throughout the thoracic spine. Soft tissues are unremarkable. IMPRESSION: 1. No fracture or acute finding. Stable appearance from the prior exam. Electronically Signed   By: Lajean Manes M.D.   On: 03/03/2017 15:32   Dg Thoracic Spine 2 View  Result Date: 03/02/2017 CLINICAL DATA:  84 year old male with fall and back pain. EXAM: THORACIC SPINE 2 VIEWS; LUMBAR SPINE - COMPLETE 4+ VIEW COMPARISON:  Abdominal radiograph dated 06/09/2016 FINDINGS: There is no definite acute fracture or subluxation of the thoracic or lumbar spine the bones are osteopenic. Multilevel degenerative changes and multilevel osteophyte consistent with diffuse idiopathic skeletal hyperostosis. Lower lumbar fixation screws noted. There is atherosclerotic calcification of the aorta. The soft tissues are grossly unremarkable IMPRESSION: 1. No definite acute fracture or subluxation of the thoracic or lumbar spine. 2. Osteopenia with extensive degenerative changes and findings of DISH. 3. Atherosclerotic aorta. Electronically Signed   By: Anner Crete M.D.   On: 03/02/2017  00:26   Dg Lumbar Spine Complete  Result Date: 03/03/2017 CLINICAL DATA:  Fall.  Low back pain. EXAM: LUMBAR SPINE - COMPLETE 4+ VIEW COMPARISON:  03/01/2017 lumbar spine radiographs FINDINGS: This report assumes 5 non rib-bearing lumbar vertebrae. Moderate levocurvature of the lumbar spine. Status post bilateral posterior spinal fusion at L4-5 with no evidence of hardware fracture or loosening. Disc spacer is present in the L4-5 disc. Lumbar vertebral body heights are preserved, with no fracture. Severe degenerative disc disease throughout each lumbar disc level, unchanged. Stable 4 mm anterolisthesis at L4-5 and L5-S1. No new spondylolisthesis. Advanced bilateral lumbar facet arthropathy. No aggressive appearing focal osseous lesions. Abdominal aortic atherosclerosis. IMPRESSION: 1. No fracture or acute malalignment. 2. Stable advanced multilevel lumbar degenerative changes and stable mild multilevel lumbar spondylolisthesis. Electronically Signed   By: Ilona Sorrel M.D.   On: 03/03/2017 13:38   Dg Lumbar Spine Complete  Result Date: 03/02/2017 CLINICAL DATA:  84 year old male with fall and back pain. EXAM: THORACIC SPINE 2 VIEWS; LUMBAR SPINE - COMPLETE 4+ VIEW COMPARISON:  Abdominal radiograph dated 06/09/2016 FINDINGS: There is no definite acute fracture or subluxation of the thoracic or lumbar spine the bones are osteopenic. Multilevel degenerative changes and multilevel osteophyte consistent with diffuse idiopathic skeletal hyperostosis. Lower lumbar fixation screws noted. There is atherosclerotic calcification of the aorta. The soft tissues are grossly unremarkable IMPRESSION: 1. No definite acute fracture or subluxation of the thoracic or lumbar spine. 2. Osteopenia with extensive degenerative changes and findings of DISH. 3. Atherosclerotic aorta. Electronically Signed   By: Anner Crete M.D.   On: 03/02/2017 00:26   Dg Tibia/fibula Right  Result Date: 03/03/2017 CLINICAL DATA:  Fall  earlier today.  Right leg pain. EXAM: RIGHT TIBIA AND FIBULA - 2 VIEW COMPARISON:  None. FINDINGS: No acute fracture. There is expansion of the proximal fibular shaft consistent with an old, healed fracture. No bone lesions. Knee and ankle joints are normally aligned. Dense chondrocalcinosis is noted along the menisci. There are vascular calcifications extending along the leg arteries. Soft tissues otherwise unremarkable. IMPRESSION: 1. No acute fracture or dislocation. Electronically Signed   By: Lajean Manes M.D.   On: 03/03/2017 14:19   Ct Head Wo Contrast  Result Date: 03/03/2017 CLINICAL DATA:  Trauma.  C-spine injury.  High clinical risk. EXAM: CT HEAD  WITHOUT CONTRAST CT CERVICAL SPINE WITHOUT CONTRAST TECHNIQUE: Multidetector CT imaging of the head and cervical spine was performed following the standard protocol without intravenous contrast. Multiplanar CT image reconstructions of the cervical spine were also generated. COMPARISON:  12/01/2008 FINDINGS: CT HEAD FINDINGS Brain: No evidence of acute infarction, hemorrhage, hydrocephalus, extra-axial collection or mass lesion/mass effect. Ventricular and sulcal enlargement is noted consistent with moderate generalized atrophy. Mild periventricular white matter hypoattenuation is present consistent with chronic microvascular ischemic change. Vascular: No hyperdense vessel or unexpected calcification. Skull: Normal. Negative for fracture or focal lesion. Sinuses/Orbits: Globes and orbits are unremarkable. There is mild inferior frontal sinus mucosal thickening. Sinuses otherwise clear. Clear mastoid air cells. Other: None. CT CERVICAL SPINE FINDINGS Alignment: Normal. Skull base and vertebrae: No acute fracture. No primary bone lesion or focal pathologic process. Soft tissues and spinal canal: No prevertebral fluid or swelling. No visible canal hematoma. Disc levels: Thick bridging osteophytes extend from C2 through the upper thoracic spine. There is  moderate loss of disc height at C4-C5, C5-C6 and C6-C7 with mild spondylotic disc bulging. No convincing disc herniation. No bone lesion. The appearance is stable from the prior study. Upper chest: No mass or adenopathy.  No acute findings. Other: None IMPRESSION: HEAD CT 1. No acute intracranial abnormalities.  No skull fracture. CERVICAL CT 1. No fracture or acute finding. Electronically Signed   By: Lajean Manes M.D.   On: 03/03/2017 13:32   Ct Cervical Spine Wo Contrast  Result Date: 03/03/2017 CLINICAL DATA:  Trauma.  C-spine injury.  High clinical risk. EXAM: CT HEAD WITHOUT CONTRAST CT CERVICAL SPINE WITHOUT CONTRAST TECHNIQUE: Multidetector CT imaging of the head and cervical spine was performed following the standard protocol without intravenous contrast. Multiplanar CT image reconstructions of the cervical spine were also generated. COMPARISON:  12/01/2008 FINDINGS: CT HEAD FINDINGS Brain: No evidence of acute infarction, hemorrhage, hydrocephalus, extra-axial collection or mass lesion/mass effect. Ventricular and sulcal enlargement is noted consistent with moderate generalized atrophy. Mild periventricular white matter hypoattenuation is present consistent with chronic microvascular ischemic change. Vascular: No hyperdense vessel or unexpected calcification. Skull: Normal. Negative for fracture or focal lesion. Sinuses/Orbits: Globes and orbits are unremarkable. There is mild inferior frontal sinus mucosal thickening. Sinuses otherwise clear. Clear mastoid air cells. Other: None. CT CERVICAL SPINE FINDINGS Alignment: Normal. Skull base and vertebrae: No acute fracture. No primary bone lesion or focal pathologic process. Soft tissues and spinal canal: No prevertebral fluid or swelling. No visible canal hematoma. Disc levels: Thick bridging osteophytes extend from C2 through the upper thoracic spine. There is moderate loss of disc height at C4-C5, C5-C6 and C6-C7 with mild spondylotic disc bulging. No  convincing disc herniation. No bone lesion. The appearance is stable from the prior study. Upper chest: No mass or adenopathy.  No acute findings. Other: None IMPRESSION: HEAD CT 1. No acute intracranial abnormalities.  No skull fracture. CERVICAL CT 1. No fracture or acute finding. Electronically Signed   By: Lajean Manes M.D.   On: 03/03/2017 13:32   Dg Chest Port 1 View  Result Date: 03/03/2017 CLINICAL DATA:  Fall.  Pain between the shoulder blades. EXAM: PORTABLE CHEST 1 VIEW COMPARISON:  12/01/2008. FINDINGS: Cardiac silhouette is normal in size. No mediastinal or hilar masses. There are prominent bronchovascular markings and mild interstitial thickening most evident in the right lower lung, similar to the prior study. There is no evidence of pneumonia or pulmonary edema. No pleural effusion or pneumothorax. Skeletal structures are demineralized. Old  healed left lateral fourth rib fracture. No acute fractures. IMPRESSION: No acute cardiopulmonary disease. Electronically Signed   By: Lajean Manes M.D.   On: 03/03/2017 14:21   Dg Hip Unilat With Pelvis 2-3 Views Right  Result Date: 03/03/2017 CLINICAL DATA:  Pt fell onto back today onto carpeted floor. PT hit is head and right hip when he fell today. Pt is also c/o lower and upper back pain. EXAM: DG HIP (WITH OR WITHOUT PELVIS) 2-3V RIGHT COMPARISON:  04/15/2015 FINDINGS: No fracture.  No bone lesion. Hip joints are normally spaced and aligned. The SI joints and symphysis pubis are normally aligned. There changes from a previous L4-L5 posterior fusion, stable from prior exam. Bones are diffusely demineralized. IMPRESSION: 1. No fracture or dislocation. Electronically Signed   By: Lajean Manes M.D.   On: 03/03/2017 13:36    Procedures Procedures (including critical care time)  Medications Ordered in ED Medications - No data to display   Initial Impression / Assessment and Plan / ED Course  I have reviewed the triage vital signs and the  nursing notes.  Pertinent labs & imaging results that were available during my care of the patient were reviewed by me and considered in my medical decision making (see chart for details).    Renata Caprice presents today for evaluation after a fall.  He takes Plavix and reports that he struck his head and is having head, right lower leg/hip pain, along with pain in-betwen his shoulder blades.  Imaging was obtained and reviewed, no significant acute abnormalities.  As this is his second fall in three days baseline labs were obtained and reviewed.  Initial troponin mildly elevated, based on presentation, patient not having any chest pain (back pain resolved with position change) I suspect it is falsely elevated.  Will repeat with I-stat.   At shift change care was transferred to [facility] PA-C who will follow pending studies, re-evaulate and determine disposition.     Final Clinical Impressions(s) / ED Diagnoses   Final diagnoses:  Fall, initial encounter    ED Discharge Orders    None       Ollen Gross 03/03/17 1630    Lajean Saver, MD 03/04/17 [phone]

## 2017-03-03 NOTE — ED Triage Notes (Signed)
Pt here for fall at home yesterday, states he did hit back of his head and on plavix.. Denies neuro symptoms. Pt states he is having right pain but able to ambulate on same leg. Pt AO x 4.

## 2017-03-03 NOTE — ED Notes (Signed)
ED Provider at bedside and notified of troponin

## 2017-03-03 NOTE — Discharge Instructions (Addendum)
Return here as needed. Follow up with your doctor. °

## 2017-03-03 NOTE — ED Notes (Signed)
Fall bracelet and yellow sock applied to pt.

## 2017-03-03 NOTE — ED Notes (Signed)
Patient transported to X-ray 

## 2017-03-05 ENCOUNTER — Observation Stay (HOSPITAL_COMMUNITY)
Admission: EM | Admit: 2017-03-05 | Discharge: 2017-03-07 | Disposition: A | Discharge status: Home / Self Care | Payer: Medicare Other | Attending: Internal Medicine | Admitting: Internal Medicine

## 2017-03-05 ENCOUNTER — Encounter (HOSPITAL_COMMUNITY): Payer: Self-pay | Admitting: Internal Medicine

## 2017-03-05 DIAGNOSIS — E860 Dehydration: Secondary | ICD-10-CM | POA: Diagnosis not present

## 2017-03-05 DIAGNOSIS — R404 Transient alteration of awareness: Secondary | ICD-10-CM | POA: Diagnosis not present

## 2017-03-05 DIAGNOSIS — R5383 Other fatigue: Secondary | ICD-10-CM | POA: Diagnosis not present

## 2017-03-05 DIAGNOSIS — Z823 Family history of stroke: Secondary | ICD-10-CM | POA: Insufficient documentation

## 2017-03-05 DIAGNOSIS — I10 Essential (primary) hypertension: Secondary | ICD-10-CM

## 2017-03-05 DIAGNOSIS — Z9181 History of falling: Secondary | ICD-10-CM | POA: Insufficient documentation

## 2017-03-05 DIAGNOSIS — Z79899 Other long term (current) drug therapy: Secondary | ICD-10-CM | POA: Insufficient documentation

## 2017-03-05 DIAGNOSIS — R748 Abnormal levels of other serum enzymes: Secondary | ICD-10-CM | POA: Diagnosis not present

## 2017-03-05 DIAGNOSIS — E785 Hyperlipidemia, unspecified: Secondary | ICD-10-CM

## 2017-03-05 DIAGNOSIS — R531 Weakness: Secondary | ICD-10-CM

## 2017-03-05 DIAGNOSIS — M546 Pain in thoracic spine: Secondary | ICD-10-CM | POA: Diagnosis not present

## 2017-03-05 LAB — CK
CK TOTAL: 570 U/L — AB (ref 49–397)
Total CK: 840 U/L — ABNORMAL HIGH (ref 49–397)

## 2017-03-05 LAB — URINALYSIS, ROUTINE W REFLEX MICROSCOPIC
BACTERIA UA: NONE SEEN
Bilirubin Urine: NEGATIVE
GLUCOSE, UA: NEGATIVE mg/dL
KETONES UR: 20 mg/dL — AB
Leukocytes, UA: NEGATIVE
NITRITE: NEGATIVE
PROTEIN: 30 mg/dL — AB
Specific Gravity, Urine: 1.024 (ref 1.005–1.030)
Squamous Epithelial / LPF: NONE SEEN
pH: 5 (ref 5.0–8.0)

## 2017-03-05 LAB — CBC WITH DIFFERENTIAL/PLATELET
BASOS ABS: 0 10*3/uL (ref 0.0–0.1)
BASOS PCT: 0 %
Eosinophils Absolute: 0.1 10*3/uL (ref 0.0–0.7)
Eosinophils Relative: 1 %
HEMATOCRIT: 39.4 % (ref 39.0–52.0)
HEMOGLOBIN: 13.2 g/dL (ref 13.0–17.0)
Lymphocytes Relative: 20 %
Lymphs Abs: 1.7 10*3/uL (ref 0.7–4.0)
MCH: 30.4 pg (ref 26.0–34.0)
MCHC: 33.5 g/dL (ref 30.0–36.0)
MCV: 90.8 fL (ref 78.0–100.0)
Monocytes Absolute: 1.4 10*3/uL — ABNORMAL HIGH (ref 0.1–1.0)
Monocytes Relative: 16 %
NEUTROS ABS: 5.5 10*3/uL (ref 1.7–7.7)
NEUTROS PCT: 63 %
Platelets: 264 10*3/uL (ref 150–400)
RBC: 4.34 MIL/uL (ref 4.22–5.81)
RDW: 14.2 % (ref 11.5–15.5)
WBC: 8.7 10*3/uL (ref 4.0–10.5)

## 2017-03-05 LAB — COMPREHENSIVE METABOLIC PANEL
ALBUMIN: 3.5 g/dL (ref 3.5–5.0)
ALK PHOS: 57 U/L (ref 38–126)
ALT: 32 U/L (ref 17–63)
ANION GAP: 13 (ref 5–15)
AST: 54 U/L — AB (ref 15–41)
BILIRUBIN TOTAL: 2.1 mg/dL — AB (ref 0.3–1.2)
BUN: 28 mg/dL — AB (ref 6–20)
CO2: 26 mmol/L (ref 22–32)
Calcium: 9.9 mg/dL (ref 8.9–10.3)
Chloride: 102 mmol/L (ref 101–111)
Creatinine, Ser: 0.88 mg/dL (ref 0.61–1.24)
GFR calc Af Amer: >60 mL/min (ref >60)
GFR calc non Af Amer: >60 mL/min (ref >60)
GLUCOSE: 103 mg/dL — AB (ref 65–99)
Potassium: 4.4 mmol/L (ref 3.5–5.1)
SODIUM: 141 mmol/L (ref 135–145)
TOTAL PROTEIN: 7.1 g/dL (ref 6.5–8.1)

## 2017-03-05 LAB — ETHANOL: Alcohol, Ethyl (B): <10 mg/dL (ref <10)

## 2017-03-05 LAB — RAPID URINE DRUG SCREEN, HOSP PERFORMED
AMPHETAMINES: NOT DETECTED
BARBITURATES: NOT DETECTED
Benzodiazepines: NOT DETECTED
Cocaine: NOT DETECTED
OPIATES: NOT DETECTED
TETRAHYDROCANNABINOL: NOT DETECTED

## 2017-03-05 LAB — AMMONIA: Ammonia: 35 umol/L (ref 9–35)

## 2017-03-05 MED ORDER — SODIUM CHLORIDE 0.9 % IV BOLUS (SEPSIS)
500.0000 mL | Freq: Once | INTRAVENOUS | Status: AC
  Administered 2017-03-05: 500 mL via INTRAVENOUS

## 2017-03-05 MED ORDER — ONDANSETRON HCL 4 MG PO TABS: 4.0000 mg | ORAL_TABLET | Freq: Four times a day (QID) | ORAL | Status: DC | PRN

## 2017-03-05 MED ORDER — TRAMADOL HCL 50 MG PO TABS
50.0000 mg | ORAL_TABLET | Freq: Once | ORAL | Status: AC
  Administered 2017-03-05: 50 mg via ORAL
  Filled 2017-03-05: qty 1

## 2017-03-05 MED ORDER — ONDANSETRON HCL 4 MG/2ML IJ SOLN: 4.0000 mg | Freq: Four times a day (QID) | INTRAMUSCULAR | Status: DC | PRN

## 2017-03-05 MED ORDER — CLOPIDOGREL BISULFATE 75 MG PO TABS
75.0000 mg | ORAL_TABLET | Freq: Every day | ORAL | Status: DC
  Administered 2017-03-05 – 2017-03-07 (×3): 75 mg via ORAL
  Filled 2017-03-05 (×3): qty 1

## 2017-03-05 MED ORDER — ZOLPIDEM TARTRATE 5 MG PO TABS
2.5000 mg | ORAL_TABLET | Freq: Once | ORAL | Status: AC
  Administered 2017-03-05: 2.5 mg via ORAL
  Filled 2017-03-05: qty 1

## 2017-03-05 MED ORDER — SODIUM CHLORIDE 0.9 % IV SOLN
INTRAVENOUS | Status: AC
  Administered 2017-03-05: 18:00:00 via INTRAVENOUS

## 2017-03-05 NOTE — ED Notes (Signed)
Bed: WA10 Expected date:  Expected time:  Means of arrival:  Comments: EMS-weakness 

## 2017-03-05 NOTE — ED Notes (Signed)
Family at bedside. 

## 2017-03-05 NOTE — H&P (Signed)
History and Physical    Marc Malone TOI:[id number] DOB: 02/06/1919 DOA: 03/05/2017  PCP: Lavone Orn, MD  Patient coming from: Assisted living  Chief Complaint: Feeling weak and falling a lot  HPI: Marc Malone is a 84 y.o. male with medical history significant of hypertension and hyperlipidemia who is living in assisted living comes in with frequent falls.  Patient reports that he just gets very weak and he is very imbalanced despite using a walker.  He denies any fevers.  He denies any nausea vomiting or diarrhea.  He denies any chest pain shortness of breath or abdominal pain.  He denies any swelling in his legs.  He denies any strokelike symptoms.  He has not been getting up and moving around as much or eating and drinking as much the last several days because he is scared to fall.  Patient found to have CPK level over 800 and referred for admission for mild dehydration.  Patient does not have physical therapy set up at his assisted living.  He denies any urinary symptoms.  Review of Systems: As per HPI otherwise 10 point review of systems negative.   Past Medical History:  Diagnosis Date  . Hyperlipidemia   . Hypertension   . Stroke [facility])     Past Surgical History:  Procedure Laterality Date  . CLEFT LIP REPAIR    . LUMBAR DISC SURGERY    . LUMBAR FUSION    . ROTATOR CUFF REPAIR Bilateral      reports that he has quit smoking. he has never used smokeless tobacco. He reports that he drinks alcohol. His drug history is not on file.  No Known Allergies  History reviewed. No pertinent family history.  No premature coronary artery disease  Prior to Admission medications   Medication Sig Start Date End Date Taking? Authorizing Provider  Ascorbic Acid (VITAMIN C PO) Take 1 tablet by mouth daily.   Yes [provider]  Cholecalciferol (VITAMIN D PO) Take 1 tablet by mouth daily.   Yes [provider]  clopidogrel (PLAVIX) 75 MG tablet Take 75 mg by  mouth daily.  02/16/15  Yes [provider]  Cyanocobalamin (VITAMIN B-12 PO) Take 1 tablet by mouth daily.   Yes [provider]  Polyethyl Glycol-Propyl Glycol (SYSTANE OP) Place 1 drop into both eyes daily as needed (dry eye).   Yes [provider]  PROLENSA 0.07 % SOLN Place 1 drop into both eyes daily.  03/08/15  Yes [provider]  traMADol (ULTRAM) 50 MG tablet Take 50 mg by mouth every 6 (six) hours as needed for moderate pain.  04/09/15  Yes [provider]  VITAMIN A PO Take 1 tablet by mouth daily.   Yes [provider]  VITAMIN E PO Take 1 tablet by mouth daily.   Yes [provider]  zolpidem (AMBIEN CR) 12.5 MG CR tablet Take 6.2 mg by mouth at bedtime.  04/05/15  Yes [provider]    Physical Exam: Vitals:   03/05/17 1145 03/05/17 1148 03/05/17 1341 03/05/17 1506  BP: (!) 160/68  (!) 162/71 140/71  Pulse: 70  84 85  Resp: 16  16 16   Temp:  97.8 F (36.6 C)    TempSrc:  Oral    SpO2: 100%  99% 100%      Constitutional: NAD, calm, comfortable Vitals:   03/05/17 1145 03/05/17 1148 03/05/17 1341 03/05/17 1506  BP: (!) 160/68  (!) 162/71 140/71  Pulse: 70  84 85  Resp: 16  16 16   Temp:  97.8 F (36.6 C)    TempSrc:  Oral    SpO2: 100%  99% 100%   Eyes: PERRL, lids and conjunctivae normal ENMT: Mucous membranes are dry posterior pharynx clear of any exudate or lesions.Normal dentition.  Neck: normal, supple, no masses, no thyromegaly Respiratory: clear to auscultation bilaterally, no wheezing, no crackles. Normal respiratory effort. No accessory muscle use.  Cardiovascular: Regular rate and rhythm, no murmurs / rubs / gallops. No extremity edema. 2+ pedal pulses. No carotid bruits.  Abdomen: no tenderness, no masses palpated. No hepatosplenomegaly. Bowel sounds positive.  Musculoskeletal: no clubbing / cyanosis. No joint deformity upper and lower extremities. Good ROM, no contractures. Normal  muscle tone.  Skin: no rashes, lesions, ulcers. No induration Neurologic: CN 2-12 grossly intact. Sensation intact, DTR normal. Strength 5/5 in all 4.  Psychiatric: Normal judgment and insight. Alert and oriented x 3. Normal mood.    Labs on Admission: I have personally reviewed following labs and imaging studies  CBC: Recent Labs  Lab 03/03/17 1245 03/05/17 1335  WBC 9.4 8.7  NEUTROABS  --  5.5  HGB 12.9* 13.2  HCT 38.8* 39.4  MCV 91.3 90.8  PLT 225 626   Basic Metabolic Panel: Recent Labs  Lab 03/03/17 1245 03/05/17 1335  NA 136 141  K 4.0 4.4  CL 102 102  CO2 21* 26  GLUCOSE 99 103*  BUN 20 28*  CREATININE 1.07 0.88  CALCIUM 9.7 9.9   GFR: Estimated Creatinine Clearance: 46.9 mL/min (by C-G formula based on SCr of 0.88 mg/dL). Liver Function Tests: Recent Labs  Lab 03/05/17 1335  AST 54*  ALT 32  ALKPHOS 57  BILITOT 2.1*  PROT 7.1  ALBUMIN 3.5   No results for input(s): LIPASE, AMYLASE in the last 168 hours. Recent Labs  Lab 03/05/17 1335  AMMONIA 35   Coagulation Profile: No results for input(s): INR, PROTIME in the last 168 hours. Cardiac Enzymes: Recent Labs  Lab 03/03/17 1245 03/05/17 1335  CKTOTAL  --  840*  TROPONINI 0.06*  --    BNP (last 3 results) No results for input(s): PROBNP in the last 8760 hours. HbA1C: No results for input(s): HGBA1C in the last 72 hours. CBG: No results for input(s): GLUCAP in the last 168 hours. Lipid Profile: No results for input(s): CHOL, HDL, LDLCALC, TRIG, CHOLHDL, LDLDIRECT in the last 72 hours. Thyroid Function Tests: No results for input(s): TSH, T4TOTAL, FREET4, T3FREE, THYROIDAB in the last 72 hours. Anemia Panel: No results for input(s): VITAMINB12, FOLATE, FERRITIN, TIBC, IRON, RETICCTPCT in the last 72 hours. Urine analysis:    Component Value Date/Time   COLORURINE YELLOW 03/05/2017 [place] 03/05/2017 1458   LABSPEC 1.024 03/05/2017 1458   PHURINE 5.0 03/05/2017 1458     GLUCOSEU NEGATIVE 03/05/2017 1458   HGBUR MODERATE (A) 03/05/2017 1458   BILIRUBINUR NEGATIVE 03/05/2017 1458   KETONESUR 20 (A) 03/05/2017 1458   PROTEINUR 30 (A) 03/05/2017 1458   UROBILINOGEN 0.2 12/21/2007 1826   NITRITE NEGATIVE 03/05/2017 1458   LEUKOCYTESUR NEGATIVE 03/05/2017 1458   Sepsis Labs: !!!!!!!!!!!!!!!!!!!!!!!!!!!!!!!!!!!!!!!!!!!! @LABRCNTIP (procalcitonin:4,lacticidven:4) )No results found for this or any previous visit (from the past 240 hour(s)).   Radiological Exams on Admission: No results found.  Old chart reviewed  Case discussed with Dr. Lita Mains in the ED   Assessment/Plan 84 year old male with some mild dehydration and elevated CPK levels with frequent falls Principal Problem:   Elevated  CPK-patient is received about 2 L of IV fluids in the ED.  Repeat CPK now.  Will place on gentle IV fluids overnight and repeat CPK level in the morning.  BUN and creatinine are normal.  Active Problems:   Weakness-obtain physical therapy evaluation, check orthostatic vitals   Hypertension-stable continue home meds   Hyperlipidemia-noted   Family is interested in rehab.  Will await to see with PT says.  They are aware they may have to pay out-of-pocket.  DVT prophylaxis: SCDs Code Status: Full Family Communication: Son Disposition Plan: Per day team Consults called: None Admission status: Observation   Larri Yehle A MD [facility]  If 7PM-7AM, please contact night-coverage [email or web address] Password [facility]  03/05/2017, 3:54 PM

## 2017-03-05 NOTE — ED Triage Notes (Signed)
Patient reports weakness x1 week and fell last Friday. Pt reporting back pain from fall and is on plavix. Son reports patient is not at baseline. A/O x4.

## 2017-03-05 NOTE — ED Provider Notes (Signed)
Marc Malone Provider Note   CSN: [id number] Arrival date & time: 03/05/17  1133     History   Chief Complaint Chief Complaint  Patient presents with  . Weakness    HPI Marc Malone is a 84 y.o. male.  HPI Patient lives in an independent living apartment.  He has had a gradual decline over the past 3 weeks.  Had multiple falls in the last week.  The last time he fell was Friday evening.  He is been unable to get up out of bed over the weekend.  Decreased p.o. intake and urination.  Continues to complain of midthoracic back pain.  He is been seen several times in the emergency department and was evaluated 2 days ago with multiple head CT scans and x-rays performed. Past Medical History:  Diagnosis Date  . Hyperlipidemia   . Hypertension   . Stroke [facility])     Patient Active Problem List   Diagnosis Date Noted  . Elevated CPK 03/05/2017  . Generalized weakness 03/05/2017  . Hypertension   . Hyperlipidemia   . Dehydration     Past Surgical History:  Procedure Laterality Date  . CLEFT LIP REPAIR    . LUMBAR DISC SURGERY    . LUMBAR FUSION    . ROTATOR CUFF REPAIR Bilateral        Home Medications    Prior to Admission medications   Medication Sig Start Date End Date Taking? Authorizing Provider  Ascorbic Acid (VITAMIN C PO) Take 1 tablet by mouth daily.   Yes [provider]  Cholecalciferol (VITAMIN D PO) Take 1 tablet by mouth daily.   Yes [provider]  clopidogrel (PLAVIX) 75 MG tablet Take 75 mg by mouth daily.  02/16/15  Yes [provider]  Cyanocobalamin (VITAMIN B-12 PO) Take 1 tablet by mouth daily.   Yes [provider]  Polyethyl Glycol-Propyl Glycol (SYSTANE OP) Place 1 drop into both eyes daily as needed (dry eye).   Yes [provider]  PROLENSA 0.07 % SOLN Place 1 drop into both eyes daily.  03/08/15  Yes [provider]  traMADol (ULTRAM) 50 MG tablet Take  50 mg by mouth every 6 (six) hours as needed for moderate pain.  04/09/15  Yes [provider]  VITAMIN A PO Take 1 tablet by mouth daily.   Yes [provider]  VITAMIN E PO Take 1 tablet by mouth daily.   Yes [provider]  zolpidem (AMBIEN CR) 12.5 MG CR tablet Take 6.2 mg by mouth at bedtime.  04/05/15  Yes [provider]    Family History History reviewed. No pertinent family history.  Social History Social History   Tobacco Use  . Smoking status: Former Research scientist (life sciences)  . Smokeless tobacco: Never Used  Substance Use Topics  . Alcohol use: Yes    Comment: 1-2 drinks per day  . Drug use: Not on file     Allergies   Patient has no known allergies.   Review of Systems Review of Systems  Constitutional: Positive for activity change and fatigue. Negative for chills and fever.  HENT: Negative for congestion, sore throat and trouble swallowing.   Eyes: Negative for visual disturbance.  Respiratory: Negative for cough, chest tightness, shortness of breath and wheezing.   Cardiovascular: Negative for chest pain, palpitations and leg swelling.  Gastrointestinal: Negative for abdominal pain, diarrhea and nausea.  Genitourinary: Positive for difficulty urinating. Negative for dysuria, frequency and hematuria.  Musculoskeletal: Positive for back pain and gait problem. Negative for myalgias, neck pain and neck stiffness.  Skin: Positive for wound. Negative for rash.  Neurological: Positive for dizziness and weakness. Negative for light-headedness, numbness and headaches.  All other systems reviewed and are negative.    Physical Exam Updated Vital Signs BP (!) 149/59   Pulse 84   Temp 98 F (36.7 C) (Oral)   Resp 18   SpO2 96%   Physical Exam  Constitutional: He is oriented to person, place, and time. He appears well-developed and well-nourished. No distress.  HENT:  Head: Normocephalic and atraumatic.  Dry mucous membranes.  Midface is stable.   No obvious scalp injury.  Eyes: EOM are normal. Pupils are equal, round, and reactive to light.  Neck: Normal range of motion. Neck supple.  No posterior midline cervical tenderness to palpation.  Cardiovascular: Normal rate and regular rhythm. Exam reveals no gallop and no friction rub.  No murmur heard. Pulmonary/Chest: Effort normal and breath sounds normal. No stridor. No respiratory distress. He has no wheezes. He has no rales.  Abdominal: Soft. Bowel sounds are normal. There is no tenderness. There is no rebound and no guarding.  Musculoskeletal: Normal range of motion. He exhibits tenderness. He exhibits no edema.  Patient has midline inferior thoracic tenderness to palpation.  There is no step-offs or deformities.  Pelvis is stable.  Full range of motion of bilateral hips without pain.  Distal pulses are 3+.  Neurological: He is alert and oriented to person, place, and time.  5/5 motor in all extremities.  Sensation is grossly intact.  Skin: Skin is warm and dry. Capillary refill takes less than 2 seconds. No rash noted. He is not diaphoretic. No erythema.  Multiple abrasions and contusions of bilateral lower extremities in various stages of healing.  Psychiatric: He has a normal mood and affect. His behavior is normal.  Nursing note and vitals reviewed.    ED Treatments / Results  Labs (all labs ordered are listed, but only abnormal results are displayed) Labs Reviewed  CBC WITH DIFFERENTIAL/PLATELET - Abnormal; Notable for the following components:      Result Value   Monocytes Absolute 1.4 (*)    All other components within normal limits  COMPREHENSIVE METABOLIC PANEL - Abnormal; Notable for the following components:   Glucose, Bld 103 (*)    BUN 28 (*)    AST 54 (*)    Total Bilirubin 2.1 (*)    All other components within normal limits  CK - Abnormal; Notable for the following components:   Total CK 840 (*)    All other components within normal limits  URINALYSIS,  ROUTINE W REFLEX MICROSCOPIC - Abnormal; Notable for the following components:   Hgb urine dipstick MODERATE (*)    Ketones, ur 20 (*)    Protein, ur 30 (*)    All other components within normal limits  CK - Abnormal; Notable for the following components:   Total CK 570 (*)    All other components within normal limits  BASIC METABOLIC PANEL - Abnormal; Notable for the following components:   Potassium 3.2 (*)    Glucose, Bld 111 (*)    BUN 22 (*)    All other components within normal limits  CBC - Abnormal; Notable for the following components:   RBC 3.91 (*)    Hemoglobin 11.8 (*)    HCT 35.4 (*)    All other components within normal limits  AMMONIA  ETHANOL  RAPID URINE DRUG SCREEN, HOSP PERFORMED  CK    EKG  EKG Interpretation None       Radiology No results found.  Procedures Procedures (including critical care time)  Medications Ordered in ED Medications  clopidogrel (PLAVIX) tablet 75 mg (75 mg Oral Given 03/06/17 0810)  0.9 %  sodium chloride infusion ( Intravenous Stopped 03/06/17 0644)  ondansetron (ZOFRAN) tablet 4 mg (not administered)    Or  ondansetron (ZOFRAN) injection 4 mg (not administered)  potassium chloride SA (K-DUR,KLOR-CON) CR tablet 40 mEq (40 mEq Oral Given 03/06/17 0810)  sodium chloride 0.9 % bolus 500 mL (0 mLs Intravenous Stopped 03/05/17 1440)  sodium chloride 0.9 % bolus 500 mL (0 mLs Intravenous Stopped 03/05/17 1621)  zolpidem (AMBIEN) tablet 2.5 mg (2.5 mg Oral Given 03/05/17 2333)  traMADol (ULTRAM) tablet 50 mg (50 mg Oral Given 03/05/17 2333)     Initial Impression / Assessment and Plan / ED Course  I have reviewed the triage vital signs and the nursing notes.  Pertinent labs & imaging results that were available during my care of the patient were reviewed by me and considered in my medical decision making (see chart for details).     Given IV fluids due to concern for dehydration.  Patient CK is elevated which may be due to his  lack of mobility. Patient had CT of his head and cervical spine performed on Saturday after his fall.  He has had recent MRI without acute findings.  Discussed with hospitalist and will admit for failure to thrive and dehydration.  Final Clinical Impressions(s) / ED Diagnoses   Final diagnoses:  Generalized weakness  Dehydration    ED Discharge Orders        Ordered    Increase activity slowly     03/06/17 0723    Diet - low sodium heart healthy     03/06/17 0723       Julianne Rice, MD 03/06/17 [phone]

## 2017-03-05 NOTE — Progress Notes (Signed)
Pt gave permission to ask questions on nursing admission history in front of his son.  Lucius Conn BSN, RN-BC Admissions RN 03/05/2017 3:50 PM

## 2017-03-05 NOTE — ED Notes (Addendum)
PT have been made aware of urine sample. Urinal in hand

## 2017-03-06 ENCOUNTER — Other Ambulatory Visit: Payer: Self-pay

## 2017-03-06 DIAGNOSIS — R748 Abnormal levels of other serum enzymes: Secondary | ICD-10-CM | POA: Diagnosis not present

## 2017-03-06 DIAGNOSIS — E860 Dehydration: Secondary | ICD-10-CM | POA: Diagnosis not present

## 2017-03-06 DIAGNOSIS — I10 Essential (primary) hypertension: Secondary | ICD-10-CM | POA: Diagnosis not present

## 2017-03-06 LAB — CBC
HEMATOCRIT: 35.4 % — AB (ref 39.0–52.0)
Hemoglobin: 11.8 g/dL — ABNORMAL LOW (ref 13.0–17.0)
MCH: 30.2 pg (ref 26.0–34.0)
MCHC: 33.3 g/dL (ref 30.0–36.0)
MCV: 90.5 fL (ref 78.0–100.0)
Platelets: 236 10*3/uL (ref 150–400)
RBC: 3.91 MIL/uL — ABNORMAL LOW (ref 4.22–5.81)
RDW: 14.4 % (ref 11.5–15.5)
WBC: 7.8 10*3/uL (ref 4.0–10.5)

## 2017-03-06 LAB — BASIC METABOLIC PANEL
ANION GAP: 11 (ref 5–15)
BUN: 22 mg/dL — ABNORMAL HIGH (ref 6–20)
CHLORIDE: 106 mmol/L (ref 101–111)
CO2: 23 mmol/L (ref 22–32)
Calcium: 9.3 mg/dL (ref 8.9–10.3)
Creatinine, Ser: 0.73 mg/dL (ref 0.61–1.24)
GFR calc Af Amer: >60 mL/min (ref >60)
GLUCOSE: 111 mg/dL — AB (ref 65–99)
POTASSIUM: 3.2 mmol/L — AB (ref 3.5–5.1)
SODIUM: 140 mmol/L (ref 135–145)

## 2017-03-06 LAB — CK: Total CK: 252 U/L (ref 49–397)

## 2017-03-06 MED ORDER — TRAMADOL HCL 50 MG PO TABS
50.0000 mg | ORAL_TABLET | Freq: Once | ORAL | Status: AC
  Administered 2017-03-06: 50 mg via ORAL
  Filled 2017-03-06: qty 1

## 2017-03-06 MED ORDER — ZOLPIDEM TARTRATE 5 MG PO TABS
2.5000 mg | ORAL_TABLET | Freq: Once | ORAL | Status: AC
  Administered 2017-03-06: 2.5 mg via ORAL
  Filled 2017-03-06: qty 1

## 2017-03-06 MED ORDER — POTASSIUM CHLORIDE CRYS ER 20 MEQ PO TBCR
40.0000 meq | EXTENDED_RELEASE_TABLET | Freq: Two times a day (BID) | ORAL | Status: AC
  Administered 2017-03-06 (×2): 40 meq via ORAL
  Filled 2017-03-06 (×2): qty 2

## 2017-03-06 NOTE — ED Notes (Signed)
Pt provided water pitcher & urinal.

## 2017-03-06 NOTE — Progress Notes (Signed)
[facility] PROGRESS NOTE    Progress Note  Marc Malone  XBM:[id number] DOB: Apr 02, 1919 DOA: 03/05/2017 PCP: Lavone Orn, MD     Brief Narrative:   Marc Malone is an 84 y.o. male past medical history of essential hypertension who lives in an assisted living facility comes in for frequent falls no asymmetrical weakness no recent fevers, nausea, diarrhea, chest pain, shortness of breath or abdominal pain.  CK was 800 in the ED  Assessment/Plan:   Principal Problem:   Elevated CPK Active Problems:   Generalized weakness   Hypertension   Hyperlipidemia  Received 2 L of normal saline in the ED a CK was checked overnight which is normal. No signs of acute renal failure. A physical therapy evaluation pending for possible skilled nursing facility.  Patient is stable for discharge. Awaiting physical therapy evaluation and social worker evaluation for discharge..  DVT prophylaxis: lovenox Family Communication:none Disposition Plan/Barrier to D/C: SNF today Code Status:     Code Status Orders  (From admission, onward)        Start     Ordered   03/05/17 1557  Full code  Continuous     03/05/17 1557    Code Status History    Date Active Date Inactive Code Status Order ID Comments User Context   This patient has a current code status but no historical code status.    Advance Directive Documentation     Most Recent Value  Type of Advance Directive  Healthcare Power of Attorney, Living will  Pre-existing out of facility DNR order (yellow form or pink MOST form)  No data  "MOST" Form in Place?  No data        IV Access:    Peripheral IV   Procedures and diagnostic studies:   No results found.   Medical Consultants:    None.  Anti-Infectives:   None  Subjective:    Marc Malone he relates he feels okay no new complaints.  Objective:    Vitals:   03/06/17 0021 03/06/17 0057 03/06/17 0121 03/06/17 0637  BP: (!) 178/67 (!)  187/77 (!) 161/61 (!) 188/74  Pulse: 82 88 80   Resp: 16 16 18 18   Temp:   99.5 F (37.5 C) 98 F (36.7 C)  TempSrc:   Oral Oral  SpO2: 97% 97% 99% 96%    Intake/Output Summary (Last 24 hours) at 03/06/2017 0715 Last data filed at 03/06/2017 0644 Gross per 24 hour  Intake 2500 ml  Output -  Net 2500 ml   There were no vitals filed for this visit.  Exam: General exam: In no acute distress. Respiratory system: Good air movement and clear to auscultation. Cardiovascular system: S1 & S2 heard, RRR.  Gastrointestinal system: Abdomen is nondistended, soft and nontender.  Central nervous system: Alert and oriented. No focal neurological deficits. Extremities: No pedal edema. Skin: No rashes, lesions or ulcers Psychiatry: Judgement and insight appear normal. Mood & affect appropriate.    Data Reviewed:    Labs: Basic Metabolic Panel: Recent Labs  Lab 03/03/17 1245 03/05/17 1335 03/06/17 0530  NA 136 141 140  K 4.0 4.4 3.2*  CL 102 102 106  CO2 21* 26 23  GLUCOSE 99 103* 111*  BUN 20 28* 22*  CREATININE 1.07 0.88 0.73  CALCIUM 9.7 9.9 9.3   GFR Estimated Creatinine Clearance: 51.6 mL/min (by C-G formula based on SCr of 0.73 mg/dL). Liver Function Tests: Recent Labs  Lab 03/05/17 1335  AST 54*  ALT 32  ALKPHOS 57  BILITOT 2.1*  PROT 7.1  ALBUMIN 3.5   No results for input(s): LIPASE, AMYLASE in the last 168 hours. Recent Labs  Lab 03/05/17 1335  AMMONIA 35   Coagulation profile No results for input(s): INR, PROTIME in the last 168 hours.  CBC: Recent Labs  Lab 03/03/17 1245 03/05/17 1335 03/06/17 0530  WBC 9.4 8.7 7.8  NEUTROABS  --  5.5  --   HGB 12.9* 13.2 11.8*  HCT 38.8* 39.4 35.4*  MCV 91.3 90.8 90.5  PLT 225 264 236   Cardiac Enzymes: Recent Labs  Lab 03/03/17 1245 03/05/17 1335 03/05/17 1752 03/06/17 0530  CKTOTAL  --  840* 570* 252  TROPONINI 0.06*  --   --   --    BNP (last 3 results) No results for input(s): PROBNP in the  last 8760 hours. CBG: No results for input(s): GLUCAP in the last 168 hours. D-Dimer: No results for input(s): DDIMER in the last 72 hours. Hgb A1c: No results for input(s): HGBA1C in the last 72 hours. Lipid Profile: No results for input(s): CHOL, HDL, LDLCALC, TRIG, CHOLHDL, LDLDIRECT in the last 72 hours. Thyroid function studies: No results for input(s): TSH, T4TOTAL, T3FREE, THYROIDAB in the last 72 hours.  Invalid input(s): FREET3 Anemia work up: No results for input(s): VITAMINB12, FOLATE, FERRITIN, TIBC, IRON, RETICCTPCT in the last 72 hours. Sepsis Labs: Recent Labs  Lab 03/03/17 1245 03/05/17 1335 03/06/17 0530  WBC 9.4 8.7 7.8   Microbiology No results found for this or any previous visit (from the past 240 hour(s)).   Medications:   . clopidogrel  75 mg Oral Q breakfast   Continuous Infusions:    LOS: 0 days   Charlynne Cousins  [facility] Pager [phone]  *Please refer to [email or web address], password [email or web address] to get updated schedule on who will round on this patient, as hospitalists switch teams weekly. If 7PM-7AM, please contact night-coverage at [email or web address], password [email or web address] for any overnight needs.  03/06/2017, 7:15 AM

## 2017-03-06 NOTE — ED Notes (Signed)
Attempted to give report, awaiting RN to call back

## 2017-03-06 NOTE — ED Notes (Signed)
PT at bedside.

## 2017-03-06 NOTE — Evaluation (Signed)
Physical Therapy Evaluation Patient Details Name: Marc Malone MRN: [id number] DOB: 28-Jun-1919 Today's Date: 03/06/2017   History of Present Illness  Marc Malone is a 84 y.o. male with medical history significant of hypertension and hyperlipidemia who is living in assisted living comes in with frequent falls.   Clinical Impression  Patient presents with decreased independence and safety with mobility due to deficits listed in PT problem list.  He has had 2 falls in past month, reports gradual decline over the past 2-3 months and is currently limited by pain, decreased balance, decreased safety with significant fear of falling.  Feel he can benefit from SNF level rehab at d/c.  If, however, pt unable to afford would need HHPT and close to 24 hour assist at home.  PT to follow acutely.    Follow Up Recommendations SNF;Supervision/Assistance - 24 hour    Equipment Recommendations  None recommended by PT    Recommendations for Other Services       Precautions / Restrictions Precautions Precautions: Fall      Mobility  Bed Mobility Overal bed mobility: Needs Assistance Bed Mobility: Supine to Sit;Sit to Sidelying     Supine to sit: Min assist;HOB elevated   Sit to sidelying: Min assist General bed mobility comments: increased time and painful with mobility, use of bed rail and assist for trunk  Transfers Overall transfer level: Needs assistance Equipment used: Rolling walker (2 wheeled) Transfers: Sit to/from Stand Sit to Stand: Min assist         General transfer comment: assist for balance up from EOB and to/from low toilet cues for use of grabbar  Ambulation/Gait Ambulation/Gait assistance: Min guard;Min assist Ambulation Distance (Feet): 140 Feet Assistive device: Rolling walker (2 wheeled) Gait Pattern/deviations: Step-through pattern;Trunk flexed;Decreased stride length;Shuffle;Wide base of support     General Gait Details: flexed posture throughout,  increased time for turning, assist for backing up, otherwise assist for balance throughout  Stairs            Wheelchair Mobility    Modified Rankin (Stroke Patients Only)       Balance Overall balance assessment: Needs assistance Sitting-balance support: Feet supported Sitting balance-Leahy Scale: Fair Sitting balance - Comments: static balance good and able to accept external perturbations, but looking up or lifting legs pt with LOB posterior   Standing balance support: Bilateral upper extremity supported;Single extremity supported;During functional activity;No upper extremity supported Standing balance-Leahy Scale: Fair Standing balance comment: static standing flexed without UE support x 30 sec, able to perform front perineal hygiene with one UE support on walker with minguard A,                              Pertinent Vitals/Pain Pain Assessment: 0-10 Pain Score: 8  Pain Location: middle of his back Pain Descriptors / Indicators: Spasm Pain Intervention(s): Monitored during session;Repositioned    Home Living Family/patient expects to be discharged to:: Private residence Living Arrangements: Alone   Type of Home: Independent living facility([facility])       Home Layout: One level Home Equipment: Shower seat - built in;Walker - 4 wheels;Grab bars - tub/shower;Grab bars - toilet;Cane - single point      Prior Function Level of Independence: Independent with assistive device(s)         Comments: was doing light meal prep in his apartment, but worse about 2 weeks ago     Hand Dominance   Dominant  Hand: Right    Extremity/Trunk Assessment   Upper Extremity Assessment Upper Extremity Assessment: LUE deficits/detail LUE Deficits / Details: AAROM WFL, strength shoulder flexion 2/5, elbow flexion 4/5    Lower Extremity Assessment Lower Extremity Assessment: Overall WFL for tasks assessed    Cervical / Trunk Assessment Cervical /  Trunk Assessment: Kyphotic  Communication   Communication: Other (comment)(slurred speech)  Cognition Arousal/Alertness: Awake/alert Behavior During Therapy: WFL for tasks assessed/performed Overall Cognitive Status: Within Functional Limits for tasks assessed                                        General Comments General comments (skin integrity, edema, etc.): multiple abrasions on bilateral LE's from falling and pt reports due to struggling to get up;  patient admits to fear of falling    Exercises     Assessment/Plan    PT Assessment Patient needs continued PT services  PT Problem List Decreased mobility;Decreased safety awareness;Decreased activity tolerance;Decreased range of motion;Decreased balance;Decreased knowledge of use of DME;Pain       PT Treatment Interventions DME instruction;Functional mobility training;Balance training;Patient/family education;Gait training;Therapeutic activities;Therapeutic exercise    PT Goals (Current goals can be found in the Care Plan section)  Acute Rehab PT Goals Patient Stated Goal: To get better and return to ILF PT Goal Formulation: With patient Time For Goal Achievement: 03/20/17 Potential to Achieve Goals: Good    Frequency Min 3X/week   Barriers to discharge Decreased caregiver support      Co-evaluation               AM-PAC PT "6 Clicks" Daily Activity  Outcome Measure Difficulty turning over in bed (including adjusting bedclothes, sheets and blankets)?: A Lot Difficulty moving from lying on back to sitting on the side of the bed? : Unable Difficulty sitting down on and standing up from a chair with arms (e.g., wheelchair, bedside commode, etc,.)?: Unable Help needed moving to and from a bed to chair (including a wheelchair)?: A Little Help needed walking in hospital room?: A Little Help needed climbing 3-5 steps with a railing? : A Lot 6 Click Score: 12    End of Session Equipment Utilized  During Treatment: Gait belt Activity Tolerance: Patient tolerated treatment well Patient left: in chair;with call bell/phone within reach   PT Visit Diagnosis: Other abnormalities of gait and mobility (R26.89);History of falling (Z91.81);Pain Pain - Right/Left: (mid) Pain - part of body: (back)    Time: 5056-9794 PT Time Calculation (min) (ACUTE ONLY): 41 min   Charges:   PT Evaluation $PT Eval Moderate Complexity: 1 Mod PT Treatments $Gait Training: 8-22 mins $Therapeutic Activity: 8-22 mins   PT G CodesMagda Kiel, [place] [phone] 03/06/2017   Reginia Naas 03/06/2017, 9:42 AM

## 2017-03-06 NOTE — NC FL2 (Addendum)
  [place] LEVEL OF CARE SCREENING TOOL     IDENTIFICATION  Patient Name: Marc Malone Birthdate: [birth year] Sex: male Admission Date (Current Location): 03/05/2017  [facility] and [place] Number:  Herbalist and Address:  [facility],  [place] [address] [place]      Provider Number: [id number]  Attending Physician Name and Address:  Phillips Grout, MD  Relative Name and Phone Number:       Current Level of Care: Hospital Recommended Level of Care: [place] Prior Approval Number:    Date Approved/Denied:   PASRR Number:   [id number]   Discharge Plan: SNF    Current Diagnoses: Patient Active Problem List   Diagnosis Date Noted  . Elevated CPK 03/05/2017  . Generalized weakness 03/05/2017  . Hypertension   . Hyperlipidemia   . Dehydration     Orientation RESPIRATION BLADDER Height & Weight     Self, Place  Normal Continent Weight:   Height:     BEHAVIORAL SYMPTOMS/MOOD NEUROLOGICAL BOWEL NUTRITION STATUS      Continent Diet(Low Sodium Heart Healthy )  AMBULATORY STATUS COMMUNICATION OF NEEDS Skin   Extensive Assist Verbally Normal                       Personal Care Assistance Level of Assistance  Bathing, Feeding, Dressing Bathing Assistance: Limited assistance Feeding assistance: Independent Dressing Assistance: Limited assistance     Functional Limitations Info  Sight, Hearing, Speech Sight Info: Impaired(Wears Glasses) Hearing Info: Impaired Speech Info: Adequate    SPECIAL CARE FACTORS FREQUENCY  PT (By licensed PT)     PT Frequency: 5x/week              Contractures Contractures Info: Not present    Additional Factors Info  Code Status, Allergies Code Status Info: Fullcode Allergies Info: Allergies: No Known Allergies           Current Medications (03/06/2017):  This is the current hospital active medication list Current Facility-Administered Medications   Medication Dose Route Frequency Provider Last Rate Last Dose  . clopidogrel (PLAVIX) tablet 75 mg  75 mg Oral Q breakfast Phillips Grout, MD   75 mg at 03/06/17 0810  . ondansetron (ZOFRAN) tablet 4 mg  4 mg Oral Q6H PRN Phillips Grout, MD       Or  . ondansetron (ZOFRAN) injection 4 mg  4 mg Intravenous Q6H PRN Derrill Kay A, MD      . potassium chloride SA (K-DUR,KLOR-CON) CR tablet 40 mEq  40 mEq Oral Q12H Charlynne Cousins, MD   40 mEq at 03/06/17 0174     Discharge Medications: Please see discharge summary for a list of discharge medications.  Relevant Imaging Results:  Relevant Lab Results:   Additional Information ssn:[id number]  Lia Hopping, LCSW

## 2017-03-06 NOTE — ED Notes (Signed)
Report given to [facility]

## 2017-03-06 NOTE — Plan of Care (Signed)
  Nutrition: Adequate nutrition will be maintained 03/06/2017 1814 - Progressing by Dorene Sorrow, RN   Elimination: Will not experience complications related to bowel motility 03/06/2017 1814 - Progressing by Dorene Sorrow, RN   Pain Managment: General experience of comfort will improve 03/06/2017 1814 - Progressing by Dorene Sorrow, RN

## 2017-03-06 NOTE — Clinical Social Work Note (Signed)
Clinical Social Work Assessment  Patient Details  Name: Marc Malone MRN: [id number] Date of Birth: [birth year]  Date of referral:  03/06/17               Reason for consult:  Facility Placement                Permission sought to share information with:  Family Supports Permission granted to share information::     Name::        Agency::  [facility]/SNF  Relationship::  Daughter/ Son   Sport and exercise psychologist Information:     Housing/Transportation Living arrangements for the past 2 months:  [place] of Information:  Adult Children Patient Interpreter Needed:  None Criminal Activity/Legal Involvement Pertinent to Current Situation/Hospitalization:  No - Comment as needed Significant Relationships:  Adult Children Lives with:  Facility Resident Do you feel safe going back to the place where you live?  Yes Need for family participation in patient care:  Yes (Comment)  Care giving concerns:  Patient needs to transition from Independent to [place].  Physical Therapy completed evaluation while patient was in ED and recommends SNF for short rehab at this time.    Social Worker assessment / plan:  CSW met with patient and daughter at bedside. Patient "finally sleeping" per daughter. Daughter reports she is the patient HCPOA and presented paperwork. She reports the patient has been living in [place] [facility] at [facility]. In the last week the patient has fell several times. She states the patient may finally need to transition to a Assisted Living.  CSW will provide a ALF list for the patient family to reference in there search.   Daughter states she may the patient back to [place] with her if they are unable to find a quality ALF facility here in [place].  Physical Therapy completed evaluation while patient was in ED and recommends SNF rehab. Patient daughter agreeable to SNF placement. CSW explain SNF process and later providing a SNF list.   Patient daughter understands the patient has not met 3 night stay for medicare insurance to help pay for his rehab stay. She is agreeable to pay privately for his care.  CSW started the SNF referral process. Patient family would like to visit the facility option before making a decision.   Plan: SNF  Employment status:  Retired Forensic scientist:  Medicare PT Recommendations:  [place] / Referral to community resources:  [place]  Patient/Family's Response to care:  Patient family is agreeable to SNF placement  Patient/Family's Understanding of and Emotional Response to Diagnosis, Current Treatment, and Prognosis:  Patient alert to self and place. Patient Adult Children live in other states but are very involved in the patient care. Patient daughter is a retired Haematologist.   Emotional Assessment Appearance:    Attitude/Demeanor/Rapport:    Affect (typically observed):  Unable to Assess Orientation:  Oriented to Self, Oriented to Place Alcohol / Substance use:  Not Applicable Psych involvement (Current and /or in the community):  No (Comment)  Discharge Needs  Concerns to be addressed:  Discharge Planning Concerns Readmission within the last 30 days:  No Current discharge risk:  Dependent with Mobility Barriers to Discharge:  Insurance Authorization(Will not have 3 Night inpatient stay. )   Lia Hopping, LCSW 03/06/2017, 1:10 PM

## 2017-03-06 NOTE — ED Notes (Signed)
PT called, was told a therapist will be down as soon as possible

## 2017-03-06 NOTE — Discharge Summary (Addendum)
Physician Discharge Summary  IOANE BHOLA WVP:[id number] DOB: 1919-03-16 DOA: 03/05/2017  PCP: Lavone Orn, MD  Admit date: 03/05/2017 Discharge date: 03/07/2017  Admitted From: ALF Disposition:  SNF  Recommendations for Outpatient Follow-up:  1. Follow up with PCP in 1-2 weeks   Home Health:No Equipment/Devices:none  Discharge Condition:stable CODE STATUS:full Diet recommendation: Heart Healthy  Brief/Interim Summary: 84 y.o. male past medical history of essential hypertension who lives in an assisted living facility comes in for frequent falls no asymmetrical weakness no recent fevers, nausea, diarrhea, chest pain, shortness of breath or abdominal pain.  CK was 800 in the ED    Discharge Diagnoses:  Principal Problem:   Elevated CPK Active Problems:   Generalized weakness   Hypertension   Hyperlipidemia He has had a history of multiple falls multiple imaging of the cervical, thoracic and lumbar spine were negative for a fracture dislocation, multiple x-rays of extremities were negative.  No events on telemetry.  He denies any chest pain or shortness of breath one set of cardiac biomarkers is negative. His CK was mildly elevated this was treated with IV fluid hydration and resolved. Physical therapy evaluated the patient the recommended skilled nursing facility.   Discharge Instructions  Discharge Instructions    Diet - low sodium heart healthy   Complete by:  As directed    Increase activity slowly   Complete by:  As directed      Allergies as of 03/07/2017   No Known Allergies     Medication List    TAKE these medications   clopidogrel 75 MG tablet Commonly known as:  PLAVIX Take 75 mg by mouth daily.   polyethylene glycol packet Commonly known as:  MIRALAX / GLYCOLAX Take 17 g by mouth daily as needed for mild constipation.   PROLENSA 0.07 % Soln Generic drug:  Bromfenac Sodium Place 1 drop into both eyes daily.   SYSTANE OP Place 1 drop into  both eyes daily as needed (dry eye).   traMADol 50 MG tablet Commonly known as:  ULTRAM Take 50 mg by mouth every 6 (six) hours as needed for moderate pain.   VITAMIN A PO Take 1 tablet by mouth daily.   VITAMIN B-12 PO Take 1 tablet by mouth daily.   VITAMIN C PO Take 1 tablet by mouth daily.   VITAMIN D PO Take 1 tablet by mouth daily.   VITAMIN E PO Take 1 tablet by mouth daily.   zolpidem 12.5 MG CR tablet Commonly known as:  AMBIEN CR Take 6.2 mg by mouth at bedtime.      Contact information for after-discharge care    Destination    [facility] .   Service:  Skilled Nursing Contact information: [place] [phone]             No Known Allergies  Consultations:  None   Procedures/Studies: Dg Cervical Spine Complete  Result Date: 03/02/2017 CLINICAL DATA:  Fall with cervical pain EXAM: CERVICAL SPINE - COMPLETE 4+ VIEW COMPARISON:  09/25/2014, CT 12/01/2008 FINDINGS: Lateral masses are within normal limits. The dens is grossly unremarkable. Prominent anterior flowing osteophytes throughout the cervical spine limits evaluation. Prevertebral soft tissue thickness is grossly within normal limits. IMPRESSION: Study is limited by positioning and habitus. Similar appearance of prominent flowing anterior osteophytes. No gross acute abnormality is seen. CT is suggested to more thoroughly evaluate the cervical spine Electronically Signed   By: Madie Reno.D.  On: 03/02/2017 00:31   Dg Thoracic Spine 2 View  Result Date: 03/03/2017 CLINICAL DATA:  Pt is having upper back pain between shoulder blades after fall onto carpet today. EXAM: THORACIC SPINE 2 VIEWS COMPARISON:  03/01/2017 FINDINGS: No fracture.  No spondylolisthesis.  No bone lesion. Skeletal structures are demineralized. There are thick bridging anterior osteophytes extending throughout the thoracic spine. Soft tissues are unremarkable. IMPRESSION: 1.  No fracture or acute finding. Stable appearance from the prior exam. Electronically Signed   By: Lajean Manes M.D.   On: 03/03/2017 15:32   Dg Thoracic Spine 2 View  Result Date: 03/02/2017 CLINICAL DATA:  84 year old male with fall and back pain. EXAM: THORACIC SPINE 2 VIEWS; LUMBAR SPINE - COMPLETE 4+ VIEW COMPARISON:  Abdominal radiograph dated 06/09/2016 FINDINGS: There is no definite acute fracture or subluxation of the thoracic or lumbar spine the bones are osteopenic. Multilevel degenerative changes and multilevel osteophyte consistent with diffuse idiopathic skeletal hyperostosis. Lower lumbar fixation screws noted. There is atherosclerotic calcification of the aorta. The soft tissues are grossly unremarkable IMPRESSION: 1. No definite acute fracture or subluxation of the thoracic or lumbar spine. 2. Osteopenia with extensive degenerative changes and findings of DISH. 3. Atherosclerotic aorta. Electronically Signed   By: Anner Crete M.D.   On: 03/02/2017 00:26   Dg Lumbar Spine Complete  Result Date: 03/03/2017 CLINICAL DATA:  Fall.  Low back pain. EXAM: LUMBAR SPINE - COMPLETE 4+ VIEW COMPARISON:  03/01/2017 lumbar spine radiographs FINDINGS: This report assumes 5 non rib-bearing lumbar vertebrae. Moderate levocurvature of the lumbar spine. Status post bilateral posterior spinal fusion at L4-5 with no evidence of hardware fracture or loosening. Disc spacer is present in the L4-5 disc. Lumbar vertebral body heights are preserved, with no fracture. Severe degenerative disc disease throughout each lumbar disc level, unchanged. Stable 4 mm anterolisthesis at L4-5 and L5-S1. No new spondylolisthesis. Advanced bilateral lumbar facet arthropathy. No aggressive appearing focal osseous lesions. Abdominal aortic atherosclerosis. IMPRESSION: 1. No fracture or acute malalignment. 2. Stable advanced multilevel lumbar degenerative changes and stable mild multilevel lumbar spondylolisthesis. Electronically  Signed   By: Ilona Sorrel M.D.   On: 03/03/2017 13:38   Dg Lumbar Spine Complete  Result Date: 03/02/2017 CLINICAL DATA:  84 year old male with fall and back pain. EXAM: THORACIC SPINE 2 VIEWS; LUMBAR SPINE - COMPLETE 4+ VIEW COMPARISON:  Abdominal radiograph dated 06/09/2016 FINDINGS: There is no definite acute fracture or subluxation of the thoracic or lumbar spine the bones are osteopenic. Multilevel degenerative changes and multilevel osteophyte consistent with diffuse idiopathic skeletal hyperostosis. Lower lumbar fixation screws noted. There is atherosclerotic calcification of the aorta. The soft tissues are grossly unremarkable IMPRESSION: 1. No definite acute fracture or subluxation of the thoracic or lumbar spine. 2. Osteopenia with extensive degenerative changes and findings of DISH. 3. Atherosclerotic aorta. Electronically Signed   By: Anner Crete M.D.   On: 03/02/2017 00:26   Dg Tibia/fibula Right  Result Date: 03/03/2017 CLINICAL DATA:  Fall earlier today.  Right leg pain. EXAM: RIGHT TIBIA AND FIBULA - 2 VIEW COMPARISON:  None. FINDINGS: No acute fracture. There is expansion of the proximal fibular shaft consistent with an old, healed fracture. No bone lesions. Knee and ankle joints are normally aligned. Dense chondrocalcinosis is noted along the menisci. There are vascular calcifications extending along the leg arteries. Soft tissues otherwise unremarkable. IMPRESSION: 1. No acute fracture or dislocation. Electronically Signed   By: Lajean Manes M.D.   On: 03/03/2017 14:19  Ct Head Wo Contrast  Result Date: 03/03/2017 CLINICAL DATA:  Trauma.  C-spine injury.  High clinical risk. EXAM: CT HEAD WITHOUT CONTRAST CT CERVICAL SPINE WITHOUT CONTRAST TECHNIQUE: Multidetector CT imaging of the head and cervical spine was performed following the standard protocol without intravenous contrast. Multiplanar CT image reconstructions of the cervical spine were also generated. COMPARISON:   12/01/2008 FINDINGS: CT HEAD FINDINGS Brain: No evidence of acute infarction, hemorrhage, hydrocephalus, extra-axial collection or mass lesion/mass effect. Ventricular and sulcal enlargement is noted consistent with moderate generalized atrophy. Mild periventricular white matter hypoattenuation is present consistent with chronic microvascular ischemic change. Vascular: No hyperdense vessel or unexpected calcification. Skull: Normal. Negative for fracture or focal lesion. Sinuses/Orbits: Globes and orbits are unremarkable. There is mild inferior frontal sinus mucosal thickening. Sinuses otherwise clear. Clear mastoid air cells. Other: None. CT CERVICAL SPINE FINDINGS Alignment: Normal. Skull base and vertebrae: No acute fracture. No primary bone lesion or focal pathologic process. Soft tissues and spinal canal: No prevertebral fluid or swelling. No visible canal hematoma. Disc levels: Thick bridging osteophytes extend from C2 through the upper thoracic spine. There is moderate loss of disc height at C4-C5, C5-C6 and C6-C7 with mild spondylotic disc bulging. No convincing disc herniation. No bone lesion. The appearance is stable from the prior study. Upper chest: No mass or adenopathy.  No acute findings. Other: None IMPRESSION: HEAD CT 1. No acute intracranial abnormalities.  No skull fracture. CERVICAL CT 1. No fracture or acute finding. Electronically Signed   By: Lajean Manes M.D.   On: 03/03/2017 13:32   Ct Cervical Spine Wo Contrast  Result Date: 03/03/2017 CLINICAL DATA:  Trauma.  C-spine injury.  High clinical risk. EXAM: CT HEAD WITHOUT CONTRAST CT CERVICAL SPINE WITHOUT CONTRAST TECHNIQUE: Multidetector CT imaging of the head and cervical spine was performed following the standard protocol without intravenous contrast. Multiplanar CT image reconstructions of the cervical spine were also generated. COMPARISON:  12/01/2008 FINDINGS: CT HEAD FINDINGS Brain: No evidence of acute infarction, hemorrhage,  hydrocephalus, extra-axial collection or mass lesion/mass effect. Ventricular and sulcal enlargement is noted consistent with moderate generalized atrophy. Mild periventricular white matter hypoattenuation is present consistent with chronic microvascular ischemic change. Vascular: No hyperdense vessel or unexpected calcification. Skull: Normal. Negative for fracture or focal lesion. Sinuses/Orbits: Globes and orbits are unremarkable. There is mild inferior frontal sinus mucosal thickening. Sinuses otherwise clear. Clear mastoid air cells. Other: None. CT CERVICAL SPINE FINDINGS Alignment: Normal. Skull base and vertebrae: No acute fracture. No primary bone lesion or focal pathologic process. Soft tissues and spinal canal: No prevertebral fluid or swelling. No visible canal hematoma. Disc levels: Thick bridging osteophytes extend from C2 through the upper thoracic spine. There is moderate loss of disc height at C4-C5, C5-C6 and C6-C7 with mild spondylotic disc bulging. No convincing disc herniation. No bone lesion. The appearance is stable from the prior study. Upper chest: No mass or adenopathy.  No acute findings. Other: None IMPRESSION: HEAD CT 1. No acute intracranial abnormalities.  No skull fracture. CERVICAL CT 1. No fracture or acute finding. Electronically Signed   By: Lajean Manes M.D.   On: 03/03/2017 13:32   Dg Chest Port 1 View  Result Date: 03/03/2017 CLINICAL DATA:  Fall.  Pain between the shoulder blades. EXAM: PORTABLE CHEST 1 VIEW COMPARISON:  12/01/2008. FINDINGS: Cardiac silhouette is normal in size. No mediastinal or hilar masses. There are prominent bronchovascular markings and mild interstitial thickening most evident in the right lower lung, similar to  the prior study. There is no evidence of pneumonia or pulmonary edema. No pleural effusion or pneumothorax. Skeletal structures are demineralized. Old healed left lateral fourth rib fracture. No acute fractures. IMPRESSION: No acute  cardiopulmonary disease. Electronically Signed   By: Lajean Manes M.D.   On: 03/03/2017 14:21   Dg Hip Unilat With Pelvis 2-3 Views Right  Result Date: 03/03/2017 CLINICAL DATA:  Pt fell onto back today onto carpeted floor. PT hit is head and right hip when he fell today. Pt is also c/o lower and upper back pain. EXAM: DG HIP (WITH OR WITHOUT PELVIS) 2-3V RIGHT COMPARISON:  04/15/2015 FINDINGS: No fracture.  No bone lesion. Hip joints are normally spaced and aligned. The SI joints and symphysis pubis are normally aligned. There changes from a previous L4-L5 posterior fusion, stable from prior exam. Bones are diffusely demineralized. IMPRESSION: 1. No fracture or dislocation. Electronically Signed   By: Lajean Manes M.D.   On: 03/03/2017 13:36      Subjective: No new complaints feels great.  Discharge Exam: Vitals:   03/06/17 2000 03/07/17 0607  BP: (!) 167/74 (!) 170/64  Pulse: 89 77  Resp:  20  Temp: 97.8 F (36.6 C) 97.9 F (36.6 C)  SpO2: 99% 96%   Vitals:   03/06/17 1326 03/06/17 1832 03/06/17 2000 03/07/17 0607  BP: (!) 142/56  (!) 167/74 (!) 170/64  Pulse: 77  89 77  Resp: 17   20  Temp: 98.3 F (36.8 C)  97.8 F (36.6 C) 97.9 F (36.6 C)  TempSrc: Oral  Oral Oral  SpO2: 97%  99% 96%  Weight:  67.1 kg (147 lb 14.9 oz)    Height:  5\' 7"  (1.702 m)      General: Pt is alert, awake, not in acute distress Cardiovascular: RRR, S1/S2 +, no rubs, no gallops Respiratory: CTA bilaterally, no wheezing, no rhonchi Abdominal: Soft, NT, ND, bowel sounds + Extremities: no edema, no cyanosis    The results of significant diagnostics from this hospitalization (including imaging, microbiology, ancillary and laboratory) are listed below for reference.     Microbiology: No results found for this or any previous visit (from the past 240 hour(s)).   Labs: BNP (last 3 results) No results for input(s): BNP in the last 8760 hours. Basic Metabolic Panel: Recent Labs  Lab  03/03/17 1245 03/05/17 1335 03/06/17 0530  NA 136 141 140  K 4.0 4.4 3.2*  CL 102 102 106  CO2 21* 26 23  GLUCOSE 99 103* 111*  BUN 20 28* 22*  CREATININE 1.07 0.88 0.73  CALCIUM 9.7 9.9 9.3   Liver Function Tests: Recent Labs  Lab 03/05/17 1335  AST 54*  ALT 32  ALKPHOS 57  BILITOT 2.1*  PROT 7.1  ALBUMIN 3.5   No results for input(s): LIPASE, AMYLASE in the last 168 hours. Recent Labs  Lab 03/05/17 1335  AMMONIA 35   CBC: Recent Labs  Lab 03/03/17 1245 03/05/17 1335 03/06/17 0530  WBC 9.4 8.7 7.8  NEUTROABS  --  5.5  --   HGB 12.9* 13.2 11.8*  HCT 38.8* 39.4 35.4*  MCV 91.3 90.8 90.5  PLT 225 264 236   Cardiac Enzymes: Recent Labs  Lab 03/03/17 1245 03/05/17 1335 03/05/17 1752 03/06/17 0530  CKTOTAL  --  840* 570* 252  TROPONINI 0.06*  --   --   --    BNP: Invalid input(s): POCBNP CBG: No results for input(s): GLUCAP in the last 168 hours. D-Dimer  No results for input(s): DDIMER in the last 72 hours. Hgb A1c No results for input(s): HGBA1C in the last 72 hours. Lipid Profile No results for input(s): CHOL, HDL, LDLCALC, TRIG, CHOLHDL, LDLDIRECT in the last 72 hours. Thyroid function studies No results for input(s): TSH, T4TOTAL, T3FREE, THYROIDAB in the last 72 hours.  Invalid input(s): FREET3 Anemia work up No results for input(s): VITAMINB12, FOLATE, FERRITIN, TIBC, IRON, RETICCTPCT in the last 72 hours. Urinalysis    Component Value Date/Time   COLORURINE YELLOW 03/05/2017 [place] 03/05/2017 1458   LABSPEC 1.024 03/05/2017 1458   PHURINE 5.0 03/05/2017 1458   GLUCOSEU NEGATIVE 03/05/2017 1458   HGBUR MODERATE (A) 03/05/2017 1458   BILIRUBINUR NEGATIVE 03/05/2017 1458   KETONESUR 20 (A) 03/05/2017 1458   PROTEINUR 30 (A) 03/05/2017 1458   UROBILINOGEN 0.2 12/21/2007 1826   NITRITE NEGATIVE 03/05/2017 1458   LEUKOCYTESUR NEGATIVE 03/05/2017 1458   Sepsis Labs Invalid input(s): PROCALCITONIN,  WBC,   LACTICIDVEN Microbiology No results found for this or any previous visit (from the past 240 hour(s)).   Time coordinating discharge: Over 30 minutes  SIGNED:   Charlynne Cousins, MD  [facility] 03/07/2017, 9:17 AM Pager   If 7PM-7AM, please contact night-coverage [email or web address] Password [email or web address]

## 2017-03-06 NOTE — Plan of Care (Signed)
Patient making progress, pt has remained free of any falls this admission

## 2017-03-06 NOTE — Clinical Social Work Placement (Addendum)
D/C summary/clinicals sent.  Patient adult children to transport.  Nurse to call report to: [phone]  CLINICAL SOCIAL WORK PLACEMENT  NOTE  Date:  03/06/2017  Patient Details  Name: Marc Malone MRN: [id number] Date of Birth: 29-Aug-1919  Clinical Social Work is seeking post-discharge placement for this patient at the [place] level of care (*CSW will initial, date and re-position this form in  chart as items are completed):  Yes   Patient/family provided with [place] Work Department's list of facilities offering this level of care within the geographic area requested by the patient (or if unable, by the patient's family).  Yes   Patient/family informed of their freedom to choose among providers that offer the needed level of care, that participate in Medicare, Medicaid or managed care program needed by the patient, have an available bed and are willing to accept the patient.  Yes   Patient/family informed of [facility]'s ownership interest in [facility] and [facility], as well as of the fact that they are under no obligation to receive care at these facilities.  PASRR submitted to EDS on 03/06/17     PASRR number received on 03/06/17     Existing PASRR number confirmed on       FL2 transmitted to all facilities in geographic area requested by pt/family on       FL2 transmitted to all facilities within larger geographic area on 03/06/17     Patient informed that his/her managed care company has contracts with or will negotiate with certain facilities, including the following:            Patient/family informed of bed offers received.  Patient chooses bed at [facility] recommends and patient chooses bed at      Patient to be transferred to [facility] on 03/07/17.  Patient to be transferred to facility by [facility]     Patient family notified on 03/07/17 of transfer.  Name of family member  notified:  Daughter- Becky     PHYSICIAN Please prepare prescriptions     Additional Comment:    _______________________________________________ Lia Hopping, LCSW 03/06/2017, 4:43 PM

## 2017-03-07 DIAGNOSIS — I10 Essential (primary) hypertension: Secondary | ICD-10-CM | POA: Diagnosis not present

## 2017-03-07 DIAGNOSIS — M545 Low back pain: Secondary | ICD-10-CM

## 2017-03-07 DIAGNOSIS — E860 Dehydration: Secondary | ICD-10-CM | POA: Diagnosis not present

## 2017-03-07 DIAGNOSIS — R748 Abnormal levels of other serum enzymes: Secondary | ICD-10-CM | POA: Diagnosis not present

## 2017-03-07 MED ORDER — GUAIFENESIN-DM 100-10 MG/5ML PO SYRP
5.0000 mL | ORAL_SOLUTION | ORAL | Status: DC | PRN
  Administered 2017-03-07 (×2): 5 mL via ORAL
  Filled 2017-03-07: qty 5

## 2017-03-07 MED ORDER — TRAMADOL HCL 50 MG PO TABS
50.0000 mg | ORAL_TABLET | Freq: Four times a day (QID) | ORAL | Status: DC | PRN
  Administered 2017-03-07: 50 mg via ORAL
  Filled 2017-03-07: qty 1

## 2017-03-07 MED ORDER — POLYETHYLENE GLYCOL 3350 17 G PO PACK: 17.0000 g | PACK | Freq: Every day | ORAL | 0 refills | Status: AC | PRN

## 2017-03-07 MED ORDER — POLYETHYLENE GLYCOL 3350 17 G PO PACK
17.0000 g | PACK | Freq: Every day | ORAL | Status: DC | PRN
  Administered 2017-03-07: 17 g via ORAL
  Filled 2017-03-07: qty 1

## 2017-03-07 NOTE — Progress Notes (Signed)
03/07/17  Reviewed discharge instructions with patient. Patient verbalized understanding of discharge instructions. Copy of discharge instructions given to patient. Packet given to daughter to give to Valetta Fuller [phone], Report given to [facility].

## 2017-03-07 NOTE — Care Management Obs Status (Signed)
Marc Malone NOTIFICATION   Patient Details  Name: Marc Malone MRN: [id number] Date of Birth: [birth year]   Medicare Observation Status Notification Given:  Yes    Wafaa Deemer, Benjaman Lobe, RN 03/07/2017, 1:18 PM

## 2017-03-07 NOTE — Progress Notes (Signed)
[facility] PROGRESS NOTE    Progress Note  Marc Malone  DJS:[id number] DOB: Oct 25, 1919 DOA: 03/05/2017 PCP: Lavone Orn, MD     Brief Narrative:   Marc Malone is an 84 y.o. male past medical history of essential hypertension who lives in an assisted living facility comes in for frequent falls no asymmetrical weakness no recent fevers, nausea, diarrhea, chest pain, shortness of breath or abdominal pain.  CK was 800 in the ED  Assessment/Plan:   Principal Problem:   Elevated CPK Active Problems:   Generalized weakness   Hypertension   Hyperlipidemia  Received 2 L of normal saline in the ED a CK was checked overnight which is normal. No signs of acute renal failure. Physical therapy evaluated the patient the recommended skilled nursing facility patient is stable for discharge  DVT prophylaxis: lovenox Family Communication:none Disposition Plan/Barrier to D/C: SNF today Code Status:     Code Status Orders  (From admission, onward)        Start     Ordered   03/05/17 1557  Full code  Continuous     03/05/17 1557    Code Status History    Date Active Date Inactive Code Status Order ID Comments User Context   This patient has a current code status but no historical code status.    Advance Directive Documentation     Most Recent Value  Type of Advance Directive  Healthcare Power of Attorney, Living will  Pre-existing out of facility DNR order (yellow form or pink MOST form)  No data  "MOST" Form in Place?  No data        IV Access:    Peripheral IV   Procedures and diagnostic studies:   No results found.   Medical Consultants:    None.  Anti-Infectives:   None  Subjective:    Renata Caprice he relates that as long as he moves his pain does not bother him.  He relates no urinary or fecal retention or incontinence.  Objective:    Vitals:   03/06/17 1326 03/06/17 1832 03/06/17 2000 03/07/17 0607  BP: (!) 142/56  (!)  167/74 (!) 170/64  Pulse: 77  89 77  Resp: 17   20  Temp: 98.3 F (36.8 C)  97.8 F (36.6 C) 97.9 F (36.6 C)  TempSrc: Oral  Oral Oral  SpO2: 97%  99% 96%  Weight:  67.1 kg (147 lb 14.9 oz)    Height:  5\' 7"  (1.702 m)      Intake/Output Summary (Last 24 hours) at 03/07/2017 0841 Last data filed at 03/07/2017 8850 Gross per 24 hour  Intake 200 ml  Output 150 ml  Net 50 ml   Filed Weights   03/06/17 1832  Weight: 67.1 kg (147 lb 14.9 oz)    Exam: General exam: In no acute distress. Respiratory system: Good air movement and clear to auscultation. Cardiovascular system: S1 & S2 heard, RRR.  Gastrointestinal system: Abdomen is nondistended, soft and nontender.  Central nervous system: Alert and oriented. No focal neurological deficits. Extremities: No pedal edema. Skin: No rashes, lesions or ulcers Psychiatry: Judgement and insight appear normal. Mood & affect appropriate.    Data Reviewed:    Labs: Basic Metabolic Panel: Recent Labs  Lab 03/03/17 1245 03/05/17 1335 03/06/17 0530  NA 136 141 140  K 4.0 4.4 3.2*  CL 102 102 106  CO2 21* 26 23  GLUCOSE 99 103* 111*  BUN 20 28* 22*  CREATININE 1.07 0.88 0.73  CALCIUM 9.7 9.9 9.3   GFR Estimated Creatinine Clearance: 51.6 mL/min (by C-G formula based on SCr of 0.73 mg/dL). Liver Function Tests: Recent Labs  Lab 03/05/17 1335  AST 54*  ALT 32  ALKPHOS 57  BILITOT 2.1*  PROT 7.1  ALBUMIN 3.5   No results for input(s): LIPASE, AMYLASE in the last 168 hours. Recent Labs  Lab 03/05/17 1335  AMMONIA 35   Coagulation profile No results for input(s): INR, PROTIME in the last 168 hours.  CBC: Recent Labs  Lab 03/03/17 1245 03/05/17 1335 03/06/17 0530  WBC 9.4 8.7 7.8  NEUTROABS  --  5.5  --   HGB 12.9* 13.2 11.8*  HCT 38.8* 39.4 35.4*  MCV 91.3 90.8 90.5  PLT 225 264 236   Cardiac Enzymes: Recent Labs  Lab 03/03/17 1245 03/05/17 1335 03/05/17 1752 03/06/17 0530  CKTOTAL  --  840* 570* 252    TROPONINI 0.06*  --   --   --    BNP (last 3 results) No results for input(s): PROBNP in the last 8760 hours. CBG: No results for input(s): GLUCAP in the last 168 hours. D-Dimer: No results for input(s): DDIMER in the last 72 hours. Hgb A1c: No results for input(s): HGBA1C in the last 72 hours. Lipid Profile: No results for input(s): CHOL, HDL, LDLCALC, TRIG, CHOLHDL, LDLDIRECT in the last 72 hours. Thyroid function studies: No results for input(s): TSH, T4TOTAL, T3FREE, THYROIDAB in the last 72 hours.  Invalid input(s): FREET3 Anemia work up: No results for input(s): VITAMINB12, FOLATE, FERRITIN, TIBC, IRON, RETICCTPCT in the last 72 hours. Sepsis Labs: Recent Labs  Lab 03/03/17 1245 03/05/17 1335 03/06/17 0530  WBC 9.4 8.7 7.8   Microbiology No results found for this or any previous visit (from the past 240 hour(s)).   Medications:   . clopidogrel  75 mg Oral Q breakfast   Continuous Infusions:    LOS: 0 days   Charlynne Cousins  [facility] Pager [phone]  *Please refer to [email or web address], password [email or web address] to get updated schedule on who will round on this patient, as hospitalists switch teams weekly. If 7PM-7AM, please contact night-coverage at [email or web address], password [email or web address] for any overnight needs.  03/07/2017, 8:41 AM

## 2017-03-07 NOTE — Plan of Care (Signed)
  Nutrition: Adequate nutrition will be maintained 03/07/2017 1319 - Progressing by Dorene Sorrow, RN   Pain Managment: General experience of comfort will improve 03/07/2017 1319 - Progressing by Dorene Sorrow, RN   Elimination: Will not experience complications related to bowel motility 03/07/2017 1319 - Progressing by Dorene Sorrow, RN

## 2017-03-08 DIAGNOSIS — R1312 Dysphagia, oropharyngeal phase: Secondary | ICD-10-CM | POA: Diagnosis not present

## 2017-03-08 DIAGNOSIS — R2681 Unsteadiness on feet: Secondary | ICD-10-CM | POA: Diagnosis not present

## 2017-03-08 DIAGNOSIS — R262 Difficulty in walking, not elsewhere classified: Secondary | ICD-10-CM | POA: Diagnosis not present

## 2017-03-08 DIAGNOSIS — R278 Other lack of coordination: Secondary | ICD-10-CM | POA: Diagnosis not present

## 2017-03-08 DIAGNOSIS — M6281 Muscle weakness (generalized): Secondary | ICD-10-CM | POA: Diagnosis not present

## 2017-03-08 DIAGNOSIS — R531 Weakness: Secondary | ICD-10-CM | POA: Diagnosis not present

## 2017-03-08 DIAGNOSIS — G8929 Other chronic pain: Secondary | ICD-10-CM | POA: Diagnosis not present

## 2017-03-08 DIAGNOSIS — I10 Essential (primary) hypertension: Secondary | ICD-10-CM | POA: Diagnosis not present

## 2017-03-09 DIAGNOSIS — R2681 Unsteadiness on feet: Secondary | ICD-10-CM | POA: Diagnosis not present

## 2017-03-09 DIAGNOSIS — R6884 Jaw pain: Secondary | ICD-10-CM | POA: Diagnosis not present

## 2017-03-09 DIAGNOSIS — R278 Other lack of coordination: Secondary | ICD-10-CM | POA: Diagnosis not present

## 2017-03-09 DIAGNOSIS — R1312 Dysphagia, oropharyngeal phase: Secondary | ICD-10-CM | POA: Diagnosis not present

## 2017-03-09 DIAGNOSIS — I10 Essential (primary) hypertension: Secondary | ICD-10-CM | POA: Diagnosis not present

## 2017-03-09 DIAGNOSIS — M6281 Muscle weakness (generalized): Secondary | ICD-10-CM | POA: Diagnosis not present

## 2017-03-09 DIAGNOSIS — M26621 Arthralgia of right temporomandibular joint: Secondary | ICD-10-CM | POA: Diagnosis not present

## 2017-03-09 DIAGNOSIS — R531 Weakness: Secondary | ICD-10-CM | POA: Diagnosis not present

## 2017-03-09 DIAGNOSIS — R262 Difficulty in walking, not elsewhere classified: Secondary | ICD-10-CM | POA: Diagnosis not present

## 2017-03-09 DIAGNOSIS — G8929 Other chronic pain: Secondary | ICD-10-CM | POA: Diagnosis not present

## 2017-03-10 DIAGNOSIS — M6281 Muscle weakness (generalized): Secondary | ICD-10-CM | POA: Diagnosis not present

## 2017-03-10 DIAGNOSIS — R2681 Unsteadiness on feet: Secondary | ICD-10-CM | POA: Diagnosis not present

## 2017-03-10 DIAGNOSIS — R262 Difficulty in walking, not elsewhere classified: Secondary | ICD-10-CM | POA: Diagnosis not present

## 2017-03-10 DIAGNOSIS — R1312 Dysphagia, oropharyngeal phase: Secondary | ICD-10-CM | POA: Diagnosis not present

## 2017-03-10 DIAGNOSIS — G8929 Other chronic pain: Secondary | ICD-10-CM | POA: Diagnosis not present

## 2017-03-10 DIAGNOSIS — R531 Weakness: Secondary | ICD-10-CM | POA: Diagnosis not present

## 2017-03-12 DIAGNOSIS — G8929 Other chronic pain: Secondary | ICD-10-CM | POA: Diagnosis not present

## 2017-03-12 DIAGNOSIS — R1312 Dysphagia, oropharyngeal phase: Secondary | ICD-10-CM | POA: Diagnosis not present

## 2017-03-12 DIAGNOSIS — M6281 Muscle weakness (generalized): Secondary | ICD-10-CM | POA: Diagnosis not present

## 2017-03-12 DIAGNOSIS — R262 Difficulty in walking, not elsewhere classified: Secondary | ICD-10-CM | POA: Diagnosis not present

## 2017-03-12 DIAGNOSIS — M26621 Arthralgia of right temporomandibular joint: Secondary | ICD-10-CM | POA: Diagnosis not present

## 2017-03-12 DIAGNOSIS — R531 Weakness: Secondary | ICD-10-CM | POA: Diagnosis not present

## 2017-03-12 DIAGNOSIS — R2681 Unsteadiness on feet: Secondary | ICD-10-CM | POA: Diagnosis not present

## 2017-03-12 DIAGNOSIS — R1319 Other dysphagia: Secondary | ICD-10-CM | POA: Diagnosis not present

## 2017-03-13 DIAGNOSIS — S00502A Unspecified superficial injury of oral cavity, initial encounter: Secondary | ICD-10-CM | POA: Diagnosis not present

## 2017-03-13 DIAGNOSIS — R1312 Dysphagia, oropharyngeal phase: Secondary | ICD-10-CM | POA: Diagnosis not present

## 2017-03-13 DIAGNOSIS — K0510 Chronic gingivitis, plaque induced: Secondary | ICD-10-CM | POA: Diagnosis not present

## 2017-03-13 DIAGNOSIS — R531 Weakness: Secondary | ICD-10-CM | POA: Diagnosis not present

## 2017-03-13 DIAGNOSIS — B370 Candidal stomatitis: Secondary | ICD-10-CM | POA: Diagnosis not present

## 2017-03-13 DIAGNOSIS — I10 Essential (primary) hypertension: Secondary | ICD-10-CM | POA: Diagnosis not present

## 2017-03-13 DIAGNOSIS — R262 Difficulty in walking, not elsewhere classified: Secondary | ICD-10-CM | POA: Diagnosis not present

## 2017-03-13 DIAGNOSIS — M6281 Muscle weakness (generalized): Secondary | ICD-10-CM | POA: Diagnosis not present

## 2017-03-13 DIAGNOSIS — R2681 Unsteadiness on feet: Secondary | ICD-10-CM | POA: Diagnosis not present

## 2017-03-13 DIAGNOSIS — G8929 Other chronic pain: Secondary | ICD-10-CM | POA: Diagnosis not present

## 2017-03-14 DIAGNOSIS — I10 Essential (primary) hypertension: Secondary | ICD-10-CM | POA: Diagnosis not present

## 2017-03-14 DIAGNOSIS — R070 Pain in throat: Secondary | ICD-10-CM | POA: Diagnosis not present

## 2017-03-14 DIAGNOSIS — R252 Cramp and spasm: Secondary | ICD-10-CM | POA: Diagnosis not present

## 2017-03-14 DIAGNOSIS — R918 Other nonspecific abnormal finding of lung field: Secondary | ICD-10-CM | POA: Diagnosis not present

## 2017-03-14 DIAGNOSIS — J36 Peritonsillar abscess: Secondary | ICD-10-CM | POA: Diagnosis not present

## 2017-03-14 DIAGNOSIS — Z7901 Long term (current) use of anticoagulants: Secondary | ICD-10-CM | POA: Diagnosis not present

## 2017-03-14 DIAGNOSIS — J439 Emphysema, unspecified: Secondary | ICD-10-CM | POA: Diagnosis not present

## 2017-03-14 DIAGNOSIS — Z8673 Personal history of transient ischemic attack (TIA), and cerebral infarction without residual deficits: Secondary | ICD-10-CM | POA: Diagnosis not present

## 2017-03-14 DIAGNOSIS — R1312 Dysphagia, oropharyngeal phase: Secondary | ICD-10-CM | POA: Diagnosis not present

## 2017-03-14 DIAGNOSIS — I2510 Atherosclerotic heart disease of native coronary artery without angina pectoris: Secondary | ICD-10-CM | POA: Diagnosis not present

## 2017-03-14 DIAGNOSIS — R531 Weakness: Secondary | ICD-10-CM | POA: Diagnosis not present

## 2017-03-14 DIAGNOSIS — Z7401 Bed confinement status: Secondary | ICD-10-CM | POA: Diagnosis not present

## 2017-03-14 DIAGNOSIS — R2681 Unsteadiness on feet: Secondary | ICD-10-CM | POA: Diagnosis not present

## 2017-03-14 DIAGNOSIS — K122 Cellulitis and abscess of mouth: Secondary | ICD-10-CM | POA: Diagnosis not present

## 2017-03-14 DIAGNOSIS — R279 Unspecified lack of coordination: Secondary | ICD-10-CM | POA: Diagnosis not present

## 2017-03-14 DIAGNOSIS — R1319 Other dysphagia: Secondary | ICD-10-CM | POA: Diagnosis not present

## 2017-03-14 DIAGNOSIS — M272 Inflammatory conditions of jaws: Secondary | ICD-10-CM | POA: Diagnosis not present

## 2017-03-14 DIAGNOSIS — L0211 Cutaneous abscess of neck: Secondary | ICD-10-CM | POA: Diagnosis not present

## 2017-03-14 DIAGNOSIS — R1310 Dysphagia, unspecified: Secondary | ICD-10-CM | POA: Diagnosis not present

## 2017-03-14 DIAGNOSIS — S00502A Unspecified superficial injury of oral cavity, initial encounter: Secondary | ICD-10-CM | POA: Diagnosis not present

## 2017-03-15 DIAGNOSIS — L03221 Cellulitis of neck: Secondary | ICD-10-CM | POA: Diagnosis not present

## 2017-03-15 DIAGNOSIS — R9431 Abnormal electrocardiogram [ECG] [EKG]: Secondary | ICD-10-CM | POA: Diagnosis not present

## 2017-03-15 DIAGNOSIS — I493 Ventricular premature depolarization: Secondary | ICD-10-CM | POA: Diagnosis not present

## 2017-03-15 DIAGNOSIS — B954 Other streptococcus as the cause of diseases classified elsewhere: Secondary | ICD-10-CM | POA: Diagnosis not present

## 2017-03-15 DIAGNOSIS — K122 Cellulitis and abscess of mouth: Secondary | ICD-10-CM | POA: Diagnosis not present

## 2017-03-16 DIAGNOSIS — Z4659 Encounter for fitting and adjustment of other gastrointestinal appliance and device: Secondary | ICD-10-CM | POA: Diagnosis not present

## 2017-03-17 DIAGNOSIS — R918 Other nonspecific abnormal finding of lung field: Secondary | ICD-10-CM | POA: Diagnosis not present

## 2017-03-18 DIAGNOSIS — Z4659 Encounter for fitting and adjustment of other gastrointestinal appliance and device: Secondary | ICD-10-CM | POA: Diagnosis not present

## 2017-03-21 DIAGNOSIS — K122 Cellulitis and abscess of mouth: Secondary | ICD-10-CM | POA: Diagnosis not present

## 2017-03-21 DIAGNOSIS — Z7409 Other reduced mobility: Secondary | ICD-10-CM | POA: Diagnosis not present

## 2017-03-22 DIAGNOSIS — K122 Cellulitis and abscess of mouth: Secondary | ICD-10-CM | POA: Diagnosis not present

## 2017-03-23 DIAGNOSIS — Z515 Encounter for palliative care: Secondary | ICD-10-CM | POA: Diagnosis not present

## 2017-03-23 DIAGNOSIS — R1312 Dysphagia, oropharyngeal phase: Secondary | ICD-10-CM | POA: Diagnosis not present

## 2017-03-23 DIAGNOSIS — K122 Cellulitis and abscess of mouth: Secondary | ICD-10-CM | POA: Diagnosis not present

## 2017-03-26 DIAGNOSIS — Z515 Encounter for palliative care: Secondary | ICD-10-CM | POA: Diagnosis not present

## 2017-03-26 DIAGNOSIS — R1312 Dysphagia, oropharyngeal phase: Secondary | ICD-10-CM | POA: Diagnosis not present

## 2017-03-26 DIAGNOSIS — K122 Cellulitis and abscess of mouth: Secondary | ICD-10-CM | POA: Diagnosis not present

## 2017-03-27 DIAGNOSIS — K122 Cellulitis and abscess of mouth: Secondary | ICD-10-CM | POA: Diagnosis not present

## 2017-03-27 DIAGNOSIS — Z515 Encounter for palliative care: Secondary | ICD-10-CM | POA: Diagnosis not present

## 2017-03-27 DIAGNOSIS — R1312 Dysphagia, oropharyngeal phase: Secondary | ICD-10-CM | POA: Diagnosis not present

## 2017-03-28 DIAGNOSIS — R296 Repeated falls: Secondary | ICD-10-CM | POA: Diagnosis not present

## 2017-03-28 DIAGNOSIS — B954 Other streptococcus as the cause of diseases classified elsewhere: Secondary | ICD-10-CM | POA: Diagnosis not present

## 2017-03-28 DIAGNOSIS — M6281 Muscle weakness (generalized): Secondary | ICD-10-CM | POA: Diagnosis not present

## 2017-03-28 DIAGNOSIS — R1312 Dysphagia, oropharyngeal phase: Secondary | ICD-10-CM | POA: Diagnosis not present

## 2017-03-28 DIAGNOSIS — L089 Local infection of the skin and subcutaneous tissue, unspecified: Secondary | ICD-10-CM | POA: Diagnosis not present

## 2017-03-28 DIAGNOSIS — R238 Other skin changes: Secondary | ICD-10-CM | POA: Diagnosis not present

## 2017-03-28 DIAGNOSIS — M25551 Pain in right hip: Secondary | ICD-10-CM | POA: Diagnosis not present

## 2017-03-28 DIAGNOSIS — L0201 Cutaneous abscess of face: Secondary | ICD-10-CM | POA: Diagnosis not present

## 2017-03-28 DIAGNOSIS — G4700 Insomnia, unspecified: Secondary | ICD-10-CM | POA: Diagnosis not present

## 2017-03-28 DIAGNOSIS — I10 Essential (primary) hypertension: Secondary | ICD-10-CM | POA: Diagnosis not present

## 2017-03-28 DIAGNOSIS — Z7901 Long term (current) use of anticoagulants: Secondary | ICD-10-CM | POA: Diagnosis not present

## 2017-03-28 DIAGNOSIS — R1310 Dysphagia, unspecified: Secondary | ICD-10-CM | POA: Diagnosis not present

## 2017-03-28 DIAGNOSIS — D649 Anemia, unspecified: Secondary | ICD-10-CM | POA: Diagnosis not present

## 2017-03-28 DIAGNOSIS — G459 Transient cerebral ischemic attack, unspecified: Secondary | ICD-10-CM | POA: Diagnosis not present

## 2017-03-28 DIAGNOSIS — J690 Pneumonitis due to inhalation of food and vomit: Secondary | ICD-10-CM | POA: Diagnosis not present

## 2017-03-28 DIAGNOSIS — B370 Candidal stomatitis: Secondary | ICD-10-CM | POA: Diagnosis not present

## 2017-03-28 DIAGNOSIS — Z743 Need for continuous supervision: Secondary | ICD-10-CM | POA: Diagnosis not present

## 2017-03-28 DIAGNOSIS — M159 Polyosteoarthritis, unspecified: Secondary | ICD-10-CM | POA: Diagnosis not present

## 2017-03-28 DIAGNOSIS — L0211 Cutaneous abscess of neck: Secondary | ICD-10-CM | POA: Diagnosis not present

## 2017-03-28 DIAGNOSIS — J36 Peritonsillar abscess: Secondary | ICD-10-CM | POA: Diagnosis not present

## 2017-03-28 DIAGNOSIS — R630 Anorexia: Secondary | ICD-10-CM | POA: Diagnosis not present

## 2017-03-28 DIAGNOSIS — R2689 Other abnormalities of gait and mobility: Secondary | ICD-10-CM | POA: Diagnosis not present

## 2017-03-29 DIAGNOSIS — R296 Repeated falls: Secondary | ICD-10-CM | POA: Diagnosis not present

## 2017-03-29 DIAGNOSIS — I10 Essential (primary) hypertension: Secondary | ICD-10-CM | POA: Diagnosis not present

## 2017-03-29 DIAGNOSIS — R1310 Dysphagia, unspecified: Secondary | ICD-10-CM | POA: Diagnosis not present

## 2017-03-29 DIAGNOSIS — J36 Peritonsillar abscess: Secondary | ICD-10-CM | POA: Diagnosis not present

## 2017-03-30 DIAGNOSIS — R630 Anorexia: Secondary | ICD-10-CM | POA: Diagnosis not present

## 2017-03-30 DIAGNOSIS — L0211 Cutaneous abscess of neck: Secondary | ICD-10-CM | POA: Diagnosis not present

## 2017-03-30 DIAGNOSIS — B370 Candidal stomatitis: Secondary | ICD-10-CM | POA: Diagnosis not present

## 2017-03-30 DIAGNOSIS — R1310 Dysphagia, unspecified: Secondary | ICD-10-CM | POA: Diagnosis not present

## 2017-04-05 DIAGNOSIS — J36 Peritonsillar abscess: Secondary | ICD-10-CM | POA: Diagnosis not present

## 2017-04-06 DIAGNOSIS — D649 Anemia, unspecified: Secondary | ICD-10-CM | POA: Diagnosis not present

## 2017-04-06 DIAGNOSIS — R630 Anorexia: Secondary | ICD-10-CM | POA: Diagnosis not present

## 2017-04-06 DIAGNOSIS — G4700 Insomnia, unspecified: Secondary | ICD-10-CM | POA: Diagnosis not present

## 2017-04-16 DIAGNOSIS — B370 Candidal stomatitis: Secondary | ICD-10-CM | POA: Diagnosis not present

## 2017-04-16 DIAGNOSIS — J36 Peritonsillar abscess: Secondary | ICD-10-CM | POA: Diagnosis not present

## 2017-04-16 DIAGNOSIS — D649 Anemia, unspecified: Secondary | ICD-10-CM | POA: Diagnosis not present

## 2017-04-20 DIAGNOSIS — M25551 Pain in right hip: Secondary | ICD-10-CM | POA: Diagnosis not present

## 2017-04-20 DIAGNOSIS — R238 Other skin changes: Secondary | ICD-10-CM | POA: Diagnosis not present

## 2017-04-20 DIAGNOSIS — M159 Polyosteoarthritis, unspecified: Secondary | ICD-10-CM | POA: Diagnosis not present

## 2017-04-24 DIAGNOSIS — R1312 Dysphagia, oropharyngeal phase: Secondary | ICD-10-CM | POA: Diagnosis not present

## 2017-04-24 DIAGNOSIS — J690 Pneumonitis due to inhalation of food and vomit: Secondary | ICD-10-CM | POA: Diagnosis not present

## 2017-05-04 DIAGNOSIS — B370 Candidal stomatitis: Secondary | ICD-10-CM | POA: Diagnosis not present

## 2017-05-04 DIAGNOSIS — G4700 Insomnia, unspecified: Secondary | ICD-10-CM | POA: Diagnosis not present

## 2017-05-04 DIAGNOSIS — L0211 Cutaneous abscess of neck: Secondary | ICD-10-CM | POA: Diagnosis not present

## 2017-05-04 DIAGNOSIS — R630 Anorexia: Secondary | ICD-10-CM | POA: Diagnosis not present

## 2017-05-10 DIAGNOSIS — K1379 Other lesions of oral mucosa: Secondary | ICD-10-CM | POA: Diagnosis not present

## 2017-05-21 DIAGNOSIS — R600 Localized edema: Secondary | ICD-10-CM | POA: Diagnosis not present

## 2017-05-21 DIAGNOSIS — R531 Weakness: Secondary | ICD-10-CM | POA: Diagnosis not present

## 2017-05-21 DIAGNOSIS — R630 Anorexia: Secondary | ICD-10-CM | POA: Diagnosis not present

## 2017-05-29 DIAGNOSIS — Z79899 Other long term (current) drug therapy: Secondary | ICD-10-CM | POA: Diagnosis not present

## 2017-05-29 DIAGNOSIS — H353223 Exudative age-related macular degeneration, left eye, with inactive scar: Secondary | ICD-10-CM | POA: Diagnosis not present

## 2017-05-29 DIAGNOSIS — H353212 Exudative age-related macular degeneration, right eye, with inactive choroidal neovascularization: Secondary | ICD-10-CM | POA: Diagnosis not present

## 2017-06-06 DIAGNOSIS — D649 Anemia, unspecified: Secondary | ICD-10-CM | POA: Diagnosis not present

## 2017-06-06 DIAGNOSIS — R471 Dysarthria and anarthria: Secondary | ICD-10-CM | POA: Diagnosis not present

## 2017-06-06 DIAGNOSIS — R600 Localized edema: Secondary | ICD-10-CM | POA: Diagnosis not present

## 2017-06-12 DIAGNOSIS — R1310 Dysphagia, unspecified: Secondary | ICD-10-CM | POA: Diagnosis not present

## 2017-06-12 DIAGNOSIS — R490 Dysphonia: Secondary | ICD-10-CM | POA: Diagnosis not present

## 2017-06-12 DIAGNOSIS — R0982 Postnasal drip: Secondary | ICD-10-CM | POA: Diagnosis not present

## 2017-06-13 DIAGNOSIS — Z9181 History of falling: Secondary | ICD-10-CM | POA: Diagnosis not present

## 2017-06-13 DIAGNOSIS — E785 Hyperlipidemia, unspecified: Secondary | ICD-10-CM | POA: Diagnosis not present

## 2017-06-13 DIAGNOSIS — M545 Low back pain: Secondary | ICD-10-CM | POA: Diagnosis not present

## 2017-06-13 DIAGNOSIS — M26629 Arthralgia of temporomandibular joint, unspecified side: Secondary | ICD-10-CM | POA: Diagnosis not present

## 2017-06-13 DIAGNOSIS — R1312 Dysphagia, oropharyngeal phase: Secondary | ICD-10-CM | POA: Diagnosis not present

## 2017-06-13 DIAGNOSIS — H04129 Dry eye syndrome of unspecified lacrimal gland: Secondary | ICD-10-CM | POA: Diagnosis not present

## 2017-06-13 DIAGNOSIS — Z993 Dependence on wheelchair: Secondary | ICD-10-CM | POA: Diagnosis not present

## 2017-06-13 DIAGNOSIS — Z7902 Long term (current) use of antithrombotics/antiplatelets: Secondary | ICD-10-CM | POA: Diagnosis not present

## 2017-06-13 DIAGNOSIS — I69354 Hemiplegia and hemiparesis following cerebral infarction affecting left non-dominant side: Secondary | ICD-10-CM | POA: Diagnosis not present

## 2017-06-13 DIAGNOSIS — F5101 Primary insomnia: Secondary | ICD-10-CM | POA: Diagnosis not present

## 2017-06-13 DIAGNOSIS — I69891 Dysphagia following other cerebrovascular disease: Secondary | ICD-10-CM | POA: Diagnosis not present

## 2017-06-13 DIAGNOSIS — I10 Essential (primary) hypertension: Secondary | ICD-10-CM | POA: Diagnosis not present

## 2017-06-13 DIAGNOSIS — G8929 Other chronic pain: Secondary | ICD-10-CM | POA: Diagnosis not present

## 2017-06-13 DIAGNOSIS — Z87891 Personal history of nicotine dependence: Secondary | ICD-10-CM | POA: Diagnosis not present

## 2017-06-15 DIAGNOSIS — R1312 Dysphagia, oropharyngeal phase: Secondary | ICD-10-CM | POA: Diagnosis not present

## 2017-06-15 DIAGNOSIS — I69891 Dysphagia following other cerebrovascular disease: Secondary | ICD-10-CM | POA: Diagnosis not present

## 2017-06-15 DIAGNOSIS — M545 Low back pain: Secondary | ICD-10-CM | POA: Diagnosis not present

## 2017-06-15 DIAGNOSIS — I69354 Hemiplegia and hemiparesis following cerebral infarction affecting left non-dominant side: Secondary | ICD-10-CM | POA: Diagnosis not present

## 2017-06-15 DIAGNOSIS — M26629 Arthralgia of temporomandibular joint, unspecified side: Secondary | ICD-10-CM | POA: Diagnosis not present

## 2017-06-15 DIAGNOSIS — I10 Essential (primary) hypertension: Secondary | ICD-10-CM | POA: Diagnosis not present

## 2017-06-15 DIAGNOSIS — H353211 Exudative age-related macular degeneration, right eye, with active choroidal neovascularization: Secondary | ICD-10-CM | POA: Diagnosis not present

## 2017-06-16 DIAGNOSIS — I69354 Hemiplegia and hemiparesis following cerebral infarction affecting left non-dominant side: Secondary | ICD-10-CM | POA: Diagnosis not present

## 2017-06-16 DIAGNOSIS — R1312 Dysphagia, oropharyngeal phase: Secondary | ICD-10-CM | POA: Diagnosis not present

## 2017-06-16 DIAGNOSIS — I10 Essential (primary) hypertension: Secondary | ICD-10-CM | POA: Diagnosis not present

## 2017-06-16 DIAGNOSIS — M26629 Arthralgia of temporomandibular joint, unspecified side: Secondary | ICD-10-CM | POA: Diagnosis not present

## 2017-06-16 DIAGNOSIS — M545 Low back pain: Secondary | ICD-10-CM | POA: Diagnosis not present

## 2017-06-16 DIAGNOSIS — I69891 Dysphagia following other cerebrovascular disease: Secondary | ICD-10-CM | POA: Diagnosis not present

## 2017-06-18 DIAGNOSIS — I10 Essential (primary) hypertension: Secondary | ICD-10-CM | POA: Diagnosis not present

## 2017-06-18 DIAGNOSIS — I69354 Hemiplegia and hemiparesis following cerebral infarction affecting left non-dominant side: Secondary | ICD-10-CM | POA: Diagnosis not present

## 2017-06-18 DIAGNOSIS — I69891 Dysphagia following other cerebrovascular disease: Secondary | ICD-10-CM | POA: Diagnosis not present

## 2017-06-18 DIAGNOSIS — M79675 Pain in left toe(s): Secondary | ICD-10-CM | POA: Diagnosis not present

## 2017-06-18 DIAGNOSIS — M545 Low back pain: Secondary | ICD-10-CM | POA: Diagnosis not present

## 2017-06-18 DIAGNOSIS — R1312 Dysphagia, oropharyngeal phase: Secondary | ICD-10-CM | POA: Diagnosis not present

## 2017-06-18 DIAGNOSIS — M26629 Arthralgia of temporomandibular joint, unspecified side: Secondary | ICD-10-CM | POA: Diagnosis not present

## 2017-06-18 DIAGNOSIS — B351 Tinea unguium: Secondary | ICD-10-CM | POA: Diagnosis not present

## 2017-06-18 DIAGNOSIS — M79674 Pain in right toe(s): Secondary | ICD-10-CM | POA: Diagnosis not present

## 2017-06-19 DIAGNOSIS — M26629 Arthralgia of temporomandibular joint, unspecified side: Secondary | ICD-10-CM | POA: Diagnosis not present

## 2017-06-19 DIAGNOSIS — M545 Low back pain: Secondary | ICD-10-CM | POA: Diagnosis not present

## 2017-06-19 DIAGNOSIS — I69891 Dysphagia following other cerebrovascular disease: Secondary | ICD-10-CM | POA: Diagnosis not present

## 2017-06-19 DIAGNOSIS — I10 Essential (primary) hypertension: Secondary | ICD-10-CM | POA: Diagnosis not present

## 2017-06-19 DIAGNOSIS — R1312 Dysphagia, oropharyngeal phase: Secondary | ICD-10-CM | POA: Diagnosis not present

## 2017-06-19 DIAGNOSIS — I69354 Hemiplegia and hemiparesis following cerebral infarction affecting left non-dominant side: Secondary | ICD-10-CM | POA: Diagnosis not present

## 2017-06-20 DIAGNOSIS — M545 Low back pain: Secondary | ICD-10-CM | POA: Diagnosis not present

## 2017-06-20 DIAGNOSIS — I69354 Hemiplegia and hemiparesis following cerebral infarction affecting left non-dominant side: Secondary | ICD-10-CM | POA: Diagnosis not present

## 2017-06-20 DIAGNOSIS — M26629 Arthralgia of temporomandibular joint, unspecified side: Secondary | ICD-10-CM | POA: Diagnosis not present

## 2017-06-20 DIAGNOSIS — I69891 Dysphagia following other cerebrovascular disease: Secondary | ICD-10-CM | POA: Diagnosis not present

## 2017-06-20 DIAGNOSIS — I10 Essential (primary) hypertension: Secondary | ICD-10-CM | POA: Diagnosis not present

## 2017-06-20 DIAGNOSIS — R1312 Dysphagia, oropharyngeal phase: Secondary | ICD-10-CM | POA: Diagnosis not present

## 2017-06-21 DIAGNOSIS — I69891 Dysphagia following other cerebrovascular disease: Secondary | ICD-10-CM | POA: Diagnosis not present

## 2017-06-21 DIAGNOSIS — I10 Essential (primary) hypertension: Secondary | ICD-10-CM | POA: Diagnosis not present

## 2017-06-21 DIAGNOSIS — I69354 Hemiplegia and hemiparesis following cerebral infarction affecting left non-dominant side: Secondary | ICD-10-CM | POA: Diagnosis not present

## 2017-06-21 DIAGNOSIS — M26629 Arthralgia of temporomandibular joint, unspecified side: Secondary | ICD-10-CM | POA: Diagnosis not present

## 2017-06-21 DIAGNOSIS — R1312 Dysphagia, oropharyngeal phase: Secondary | ICD-10-CM | POA: Diagnosis not present

## 2017-06-21 DIAGNOSIS — M545 Low back pain: Secondary | ICD-10-CM | POA: Diagnosis not present

## 2017-06-25 DIAGNOSIS — I69354 Hemiplegia and hemiparesis following cerebral infarction affecting left non-dominant side: Secondary | ICD-10-CM | POA: Diagnosis not present

## 2017-06-25 DIAGNOSIS — R1312 Dysphagia, oropharyngeal phase: Secondary | ICD-10-CM | POA: Diagnosis not present

## 2017-06-25 DIAGNOSIS — M545 Low back pain: Secondary | ICD-10-CM | POA: Diagnosis not present

## 2017-06-25 DIAGNOSIS — M26629 Arthralgia of temporomandibular joint, unspecified side: Secondary | ICD-10-CM | POA: Diagnosis not present

## 2017-06-25 DIAGNOSIS — I69891 Dysphagia following other cerebrovascular disease: Secondary | ICD-10-CM | POA: Diagnosis not present

## 2017-06-25 DIAGNOSIS — I10 Essential (primary) hypertension: Secondary | ICD-10-CM | POA: Diagnosis not present

## 2017-06-26 DIAGNOSIS — R1312 Dysphagia, oropharyngeal phase: Secondary | ICD-10-CM | POA: Diagnosis not present

## 2017-06-26 DIAGNOSIS — M545 Low back pain: Secondary | ICD-10-CM | POA: Diagnosis not present

## 2017-06-26 DIAGNOSIS — M26629 Arthralgia of temporomandibular joint, unspecified side: Secondary | ICD-10-CM | POA: Diagnosis not present

## 2017-06-26 DIAGNOSIS — I69891 Dysphagia following other cerebrovascular disease: Secondary | ICD-10-CM | POA: Diagnosis not present

## 2017-06-26 DIAGNOSIS — I69354 Hemiplegia and hemiparesis following cerebral infarction affecting left non-dominant side: Secondary | ICD-10-CM | POA: Diagnosis not present

## 2017-06-26 DIAGNOSIS — I10 Essential (primary) hypertension: Secondary | ICD-10-CM | POA: Diagnosis not present

## 2017-06-28 DIAGNOSIS — I10 Essential (primary) hypertension: Secondary | ICD-10-CM | POA: Diagnosis not present

## 2017-06-28 DIAGNOSIS — R1312 Dysphagia, oropharyngeal phase: Secondary | ICD-10-CM | POA: Diagnosis not present

## 2017-06-28 DIAGNOSIS — I69891 Dysphagia following other cerebrovascular disease: Secondary | ICD-10-CM | POA: Diagnosis not present

## 2017-06-28 DIAGNOSIS — M545 Low back pain: Secondary | ICD-10-CM | POA: Diagnosis not present

## 2017-06-28 DIAGNOSIS — M26629 Arthralgia of temporomandibular joint, unspecified side: Secondary | ICD-10-CM | POA: Diagnosis not present

## 2017-06-28 DIAGNOSIS — I69354 Hemiplegia and hemiparesis following cerebral infarction affecting left non-dominant side: Secondary | ICD-10-CM | POA: Diagnosis not present

## 2017-07-03 DIAGNOSIS — I69354 Hemiplegia and hemiparesis following cerebral infarction affecting left non-dominant side: Secondary | ICD-10-CM | POA: Diagnosis not present

## 2017-07-03 DIAGNOSIS — M545 Low back pain: Secondary | ICD-10-CM | POA: Diagnosis not present

## 2017-07-03 DIAGNOSIS — M26629 Arthralgia of temporomandibular joint, unspecified side: Secondary | ICD-10-CM | POA: Diagnosis not present

## 2017-07-03 DIAGNOSIS — I69891 Dysphagia following other cerebrovascular disease: Secondary | ICD-10-CM | POA: Diagnosis not present

## 2017-07-03 DIAGNOSIS — I10 Essential (primary) hypertension: Secondary | ICD-10-CM | POA: Diagnosis not present

## 2017-07-03 DIAGNOSIS — R1312 Dysphagia, oropharyngeal phase: Secondary | ICD-10-CM | POA: Diagnosis not present

## 2017-07-04 DIAGNOSIS — R1312 Dysphagia, oropharyngeal phase: Secondary | ICD-10-CM | POA: Diagnosis not present

## 2017-07-04 DIAGNOSIS — M26629 Arthralgia of temporomandibular joint, unspecified side: Secondary | ICD-10-CM | POA: Diagnosis not present

## 2017-07-04 DIAGNOSIS — M545 Low back pain: Secondary | ICD-10-CM | POA: Diagnosis not present

## 2017-07-04 DIAGNOSIS — I69891 Dysphagia following other cerebrovascular disease: Secondary | ICD-10-CM | POA: Diagnosis not present

## 2017-07-04 DIAGNOSIS — I69354 Hemiplegia and hemiparesis following cerebral infarction affecting left non-dominant side: Secondary | ICD-10-CM | POA: Diagnosis not present

## 2017-07-04 DIAGNOSIS — I10 Essential (primary) hypertension: Secondary | ICD-10-CM | POA: Diagnosis not present

## 2017-07-05 DIAGNOSIS — I69891 Dysphagia following other cerebrovascular disease: Secondary | ICD-10-CM | POA: Diagnosis not present

## 2017-07-05 DIAGNOSIS — R1312 Dysphagia, oropharyngeal phase: Secondary | ICD-10-CM | POA: Diagnosis not present

## 2017-07-05 DIAGNOSIS — I10 Essential (primary) hypertension: Secondary | ICD-10-CM | POA: Diagnosis not present

## 2017-07-05 DIAGNOSIS — I69354 Hemiplegia and hemiparesis following cerebral infarction affecting left non-dominant side: Secondary | ICD-10-CM | POA: Diagnosis not present

## 2017-07-05 DIAGNOSIS — M545 Low back pain: Secondary | ICD-10-CM | POA: Diagnosis not present

## 2017-07-05 DIAGNOSIS — M26629 Arthralgia of temporomandibular joint, unspecified side: Secondary | ICD-10-CM | POA: Diagnosis not present

## 2017-07-10 DIAGNOSIS — I10 Essential (primary) hypertension: Secondary | ICD-10-CM | POA: Diagnosis not present

## 2017-07-10 DIAGNOSIS — I69891 Dysphagia following other cerebrovascular disease: Secondary | ICD-10-CM | POA: Diagnosis not present

## 2017-07-10 DIAGNOSIS — M545 Low back pain: Secondary | ICD-10-CM | POA: Diagnosis not present

## 2017-07-10 DIAGNOSIS — M26629 Arthralgia of temporomandibular joint, unspecified side: Secondary | ICD-10-CM | POA: Diagnosis not present

## 2017-07-10 DIAGNOSIS — R1312 Dysphagia, oropharyngeal phase: Secondary | ICD-10-CM | POA: Diagnosis not present

## 2017-07-10 DIAGNOSIS — I69354 Hemiplegia and hemiparesis following cerebral infarction affecting left non-dominant side: Secondary | ICD-10-CM | POA: Diagnosis not present

## 2017-07-11 DIAGNOSIS — I69891 Dysphagia following other cerebrovascular disease: Secondary | ICD-10-CM | POA: Diagnosis not present

## 2017-07-11 DIAGNOSIS — R1312 Dysphagia, oropharyngeal phase: Secondary | ICD-10-CM | POA: Diagnosis not present

## 2017-07-11 DIAGNOSIS — I69354 Hemiplegia and hemiparesis following cerebral infarction affecting left non-dominant side: Secondary | ICD-10-CM | POA: Diagnosis not present

## 2017-07-11 DIAGNOSIS — M26629 Arthralgia of temporomandibular joint, unspecified side: Secondary | ICD-10-CM | POA: Diagnosis not present

## 2017-07-11 DIAGNOSIS — M545 Low back pain: Secondary | ICD-10-CM | POA: Diagnosis not present

## 2017-07-11 DIAGNOSIS — I10 Essential (primary) hypertension: Secondary | ICD-10-CM | POA: Diagnosis not present

## 2017-07-12 DIAGNOSIS — R1312 Dysphagia, oropharyngeal phase: Secondary | ICD-10-CM | POA: Diagnosis not present

## 2017-07-12 DIAGNOSIS — M26629 Arthralgia of temporomandibular joint, unspecified side: Secondary | ICD-10-CM | POA: Diagnosis not present

## 2017-07-12 DIAGNOSIS — I69891 Dysphagia following other cerebrovascular disease: Secondary | ICD-10-CM | POA: Diagnosis not present

## 2017-07-12 DIAGNOSIS — M545 Low back pain: Secondary | ICD-10-CM | POA: Diagnosis not present

## 2017-07-12 DIAGNOSIS — I10 Essential (primary) hypertension: Secondary | ICD-10-CM | POA: Diagnosis not present

## 2017-07-12 DIAGNOSIS — I69354 Hemiplegia and hemiparesis following cerebral infarction affecting left non-dominant side: Secondary | ICD-10-CM | POA: Diagnosis not present

## 2017-07-16 DIAGNOSIS — M26629 Arthralgia of temporomandibular joint, unspecified side: Secondary | ICD-10-CM | POA: Diagnosis not present

## 2017-07-16 DIAGNOSIS — R1312 Dysphagia, oropharyngeal phase: Secondary | ICD-10-CM | POA: Diagnosis not present

## 2017-07-16 DIAGNOSIS — I69354 Hemiplegia and hemiparesis following cerebral infarction affecting left non-dominant side: Secondary | ICD-10-CM | POA: Diagnosis not present

## 2017-07-16 DIAGNOSIS — I10 Essential (primary) hypertension: Secondary | ICD-10-CM | POA: Diagnosis not present

## 2017-07-16 DIAGNOSIS — I69891 Dysphagia following other cerebrovascular disease: Secondary | ICD-10-CM | POA: Diagnosis not present

## 2017-07-16 DIAGNOSIS — M545 Low back pain: Secondary | ICD-10-CM | POA: Diagnosis not present

## 2017-07-18 DIAGNOSIS — R1312 Dysphagia, oropharyngeal phase: Secondary | ICD-10-CM | POA: Diagnosis not present

## 2017-07-18 DIAGNOSIS — M545 Low back pain: Secondary | ICD-10-CM | POA: Diagnosis not present

## 2017-07-18 DIAGNOSIS — M26629 Arthralgia of temporomandibular joint, unspecified side: Secondary | ICD-10-CM | POA: Diagnosis not present

## 2017-07-18 DIAGNOSIS — I10 Essential (primary) hypertension: Secondary | ICD-10-CM | POA: Diagnosis not present

## 2017-07-18 DIAGNOSIS — I69891 Dysphagia following other cerebrovascular disease: Secondary | ICD-10-CM | POA: Diagnosis not present

## 2017-07-18 DIAGNOSIS — I69354 Hemiplegia and hemiparesis following cerebral infarction affecting left non-dominant side: Secondary | ICD-10-CM | POA: Diagnosis not present

## 2017-07-19 DIAGNOSIS — Z79899 Other long term (current) drug therapy: Secondary | ICD-10-CM | POA: Diagnosis not present

## 2017-07-23 DIAGNOSIS — R1310 Dysphagia, unspecified: Secondary | ICD-10-CM | POA: Diagnosis not present

## 2017-07-23 DIAGNOSIS — I69891 Dysphagia following other cerebrovascular disease: Secondary | ICD-10-CM | POA: Diagnosis not present

## 2017-07-23 DIAGNOSIS — M545 Low back pain: Secondary | ICD-10-CM | POA: Diagnosis not present

## 2017-07-23 DIAGNOSIS — F329 Major depressive disorder, single episode, unspecified: Secondary | ICD-10-CM | POA: Diagnosis not present

## 2017-07-23 DIAGNOSIS — R1312 Dysphagia, oropharyngeal phase: Secondary | ICD-10-CM | POA: Diagnosis not present

## 2017-07-23 DIAGNOSIS — M26629 Arthralgia of temporomandibular joint, unspecified side: Secondary | ICD-10-CM | POA: Diagnosis not present

## 2017-07-23 DIAGNOSIS — R630 Anorexia: Secondary | ICD-10-CM | POA: Diagnosis not present

## 2017-07-23 DIAGNOSIS — D509 Iron deficiency anemia, unspecified: Secondary | ICD-10-CM | POA: Diagnosis not present

## 2017-07-23 DIAGNOSIS — I10 Essential (primary) hypertension: Secondary | ICD-10-CM | POA: Diagnosis not present

## 2017-07-23 DIAGNOSIS — I69354 Hemiplegia and hemiparesis following cerebral infarction affecting left non-dominant side: Secondary | ICD-10-CM | POA: Diagnosis not present

## 2017-07-24 DIAGNOSIS — H353211 Exudative age-related macular degeneration, right eye, with active choroidal neovascularization: Secondary | ICD-10-CM | POA: Diagnosis not present

## 2017-07-25 DIAGNOSIS — M545 Low back pain: Secondary | ICD-10-CM | POA: Diagnosis not present

## 2017-07-25 DIAGNOSIS — R1312 Dysphagia, oropharyngeal phase: Secondary | ICD-10-CM | POA: Diagnosis not present

## 2017-07-25 DIAGNOSIS — M26629 Arthralgia of temporomandibular joint, unspecified side: Secondary | ICD-10-CM | POA: Diagnosis not present

## 2017-07-25 DIAGNOSIS — I10 Essential (primary) hypertension: Secondary | ICD-10-CM | POA: Diagnosis not present

## 2017-07-25 DIAGNOSIS — I69354 Hemiplegia and hemiparesis following cerebral infarction affecting left non-dominant side: Secondary | ICD-10-CM | POA: Diagnosis not present

## 2017-07-25 DIAGNOSIS — I69891 Dysphagia following other cerebrovascular disease: Secondary | ICD-10-CM | POA: Diagnosis not present

## 2017-07-27 DIAGNOSIS — Z87891 Personal history of nicotine dependence: Secondary | ICD-10-CM | POA: Diagnosis not present

## 2017-07-27 DIAGNOSIS — R05 Cough: Secondary | ICD-10-CM | POA: Diagnosis not present

## 2017-07-27 DIAGNOSIS — J383 Other diseases of vocal cords: Secondary | ICD-10-CM | POA: Diagnosis not present

## 2017-07-27 DIAGNOSIS — Z993 Dependence on wheelchair: Secondary | ICD-10-CM | POA: Diagnosis not present

## 2017-07-27 DIAGNOSIS — M6281 Muscle weakness (generalized): Secondary | ICD-10-CM | POA: Diagnosis not present

## 2017-07-27 DIAGNOSIS — Z66 Do not resuscitate: Secondary | ICD-10-CM | POA: Diagnosis not present

## 2017-07-27 DIAGNOSIS — D509 Iron deficiency anemia, unspecified: Secondary | ICD-10-CM | POA: Diagnosis not present

## 2017-07-27 DIAGNOSIS — J90 Pleural effusion, not elsewhere classified: Secondary | ICD-10-CM | POA: Diagnosis not present

## 2017-07-27 DIAGNOSIS — Z7902 Long term (current) use of antithrombotics/antiplatelets: Secondary | ICD-10-CM | POA: Diagnosis not present

## 2017-07-27 DIAGNOSIS — R471 Dysarthria and anarthria: Secondary | ICD-10-CM | POA: Diagnosis not present

## 2017-07-27 DIAGNOSIS — Z79899 Other long term (current) drug therapy: Secondary | ICD-10-CM | POA: Diagnosis not present

## 2017-07-27 DIAGNOSIS — F419 Anxiety disorder, unspecified: Secondary | ICD-10-CM | POA: Diagnosis not present

## 2017-07-27 DIAGNOSIS — G8929 Other chronic pain: Secondary | ICD-10-CM | POA: Diagnosis not present

## 2017-07-27 DIAGNOSIS — R1310 Dysphagia, unspecified: Secondary | ICD-10-CM | POA: Diagnosis not present

## 2017-07-27 DIAGNOSIS — Z8673 Personal history of transient ischemic attack (TIA), and cerebral infarction without residual deficits: Secondary | ICD-10-CM | POA: Diagnosis not present

## 2017-07-27 DIAGNOSIS — J690 Pneumonitis due to inhalation of food and vomit: Secondary | ICD-10-CM | POA: Diagnosis not present

## 2017-07-27 DIAGNOSIS — R0600 Dyspnea, unspecified: Secondary | ICD-10-CM | POA: Diagnosis not present

## 2017-07-27 DIAGNOSIS — E876 Hypokalemia: Secondary | ICD-10-CM | POA: Diagnosis not present

## 2017-07-28 DIAGNOSIS — R471 Dysarthria and anarthria: Secondary | ICD-10-CM | POA: Diagnosis not present

## 2017-07-28 DIAGNOSIS — Z8673 Personal history of transient ischemic attack (TIA), and cerebral infarction without residual deficits: Secondary | ICD-10-CM | POA: Diagnosis not present

## 2017-07-28 DIAGNOSIS — J690 Pneumonitis due to inhalation of food and vomit: Secondary | ICD-10-CM | POA: Diagnosis not present

## 2017-07-28 DIAGNOSIS — J90 Pleural effusion, not elsewhere classified: Secondary | ICD-10-CM | POA: Diagnosis not present

## 2017-07-29 DIAGNOSIS — C782 Secondary malignant neoplasm of pleura: Secondary | ICD-10-CM | POA: Diagnosis not present

## 2017-07-29 DIAGNOSIS — Z8673 Personal history of transient ischemic attack (TIA), and cerebral infarction without residual deficits: Secondary | ICD-10-CM | POA: Diagnosis not present

## 2017-07-29 DIAGNOSIS — R4702 Dysphasia: Secondary | ICD-10-CM | POA: Diagnosis not present

## 2017-07-29 DIAGNOSIS — D649 Anemia, unspecified: Secondary | ICD-10-CM | POA: Diagnosis not present

## 2017-07-29 DIAGNOSIS — J90 Pleural effusion, not elsewhere classified: Secondary | ICD-10-CM | POA: Diagnosis not present

## 2017-07-29 DIAGNOSIS — R471 Dysarthria and anarthria: Secondary | ICD-10-CM | POA: Diagnosis not present

## 2017-07-29 DIAGNOSIS — J690 Pneumonitis due to inhalation of food and vomit: Secondary | ICD-10-CM | POA: Diagnosis not present

## 2017-07-30 DIAGNOSIS — R471 Dysarthria and anarthria: Secondary | ICD-10-CM | POA: Diagnosis not present

## 2017-07-30 DIAGNOSIS — J690 Pneumonitis due to inhalation of food and vomit: Secondary | ICD-10-CM | POA: Diagnosis not present

## 2017-07-30 DIAGNOSIS — Z8673 Personal history of transient ischemic attack (TIA), and cerebral infarction without residual deficits: Secondary | ICD-10-CM | POA: Diagnosis not present

## 2017-07-30 DIAGNOSIS — J90 Pleural effusion, not elsewhere classified: Secondary | ICD-10-CM | POA: Diagnosis not present

## 2017-08-01 DIAGNOSIS — E785 Hyperlipidemia, unspecified: Secondary | ICD-10-CM | POA: Diagnosis not present

## 2017-08-01 DIAGNOSIS — I6930 Unspecified sequelae of cerebral infarction: Secondary | ICD-10-CM | POA: Diagnosis not present

## 2017-08-01 DIAGNOSIS — R262 Difficulty in walking, not elsewhere classified: Secondary | ICD-10-CM | POA: Diagnosis not present

## 2017-08-01 DIAGNOSIS — I10 Essential (primary) hypertension: Secondary | ICD-10-CM | POA: Diagnosis not present

## 2017-08-01 DIAGNOSIS — R1312 Dysphagia, oropharyngeal phase: Secondary | ICD-10-CM | POA: Diagnosis not present

## 2017-08-01 DIAGNOSIS — M6281 Muscle weakness (generalized): Secondary | ICD-10-CM | POA: Diagnosis not present

## 2017-08-01 DIAGNOSIS — R2681 Unsteadiness on feet: Secondary | ICD-10-CM | POA: Diagnosis not present

## 2017-08-02 DIAGNOSIS — I6930 Unspecified sequelae of cerebral infarction: Secondary | ICD-10-CM | POA: Diagnosis not present

## 2017-08-02 DIAGNOSIS — R2681 Unsteadiness on feet: Secondary | ICD-10-CM | POA: Diagnosis not present

## 2017-08-02 DIAGNOSIS — R262 Difficulty in walking, not elsewhere classified: Secondary | ICD-10-CM | POA: Diagnosis not present

## 2017-08-02 DIAGNOSIS — M6281 Muscle weakness (generalized): Secondary | ICD-10-CM | POA: Diagnosis not present

## 2017-08-02 DIAGNOSIS — R1312 Dysphagia, oropharyngeal phase: Secondary | ICD-10-CM | POA: Diagnosis not present

## 2017-08-02 DIAGNOSIS — I10 Essential (primary) hypertension: Secondary | ICD-10-CM | POA: Diagnosis not present

## 2017-08-03 DIAGNOSIS — I6930 Unspecified sequelae of cerebral infarction: Secondary | ICD-10-CM | POA: Diagnosis not present

## 2017-08-03 DIAGNOSIS — I10 Essential (primary) hypertension: Secondary | ICD-10-CM | POA: Diagnosis not present

## 2017-08-03 DIAGNOSIS — R2681 Unsteadiness on feet: Secondary | ICD-10-CM | POA: Diagnosis not present

## 2017-08-03 DIAGNOSIS — M6281 Muscle weakness (generalized): Secondary | ICD-10-CM | POA: Diagnosis not present

## 2017-08-03 DIAGNOSIS — R262 Difficulty in walking, not elsewhere classified: Secondary | ICD-10-CM | POA: Diagnosis not present

## 2017-08-03 DIAGNOSIS — R1312 Dysphagia, oropharyngeal phase: Secondary | ICD-10-CM | POA: Diagnosis not present

## 2017-08-09 DEATH — deceased

## 2018-07-24 IMAGING — CT CT CERVICAL SPINE W/O CM
3 of 5 series · 15 of 36 positions shown, 17 images · non-contrast
Comparison: 12/01/2008

CLINICAL DATA: Trauma.  C-spine injury.  High clinical risk.

EXAM:
CT HEAD WITHOUT CONTRAST
CT CERVICAL SPINE WITHOUT CONTRAST
TECHNIQUE: Multidetector CT imaging of the head and cervical spine was
performed following the standard protocol without intravenous
contrast. Multiplanar CT image reconstructions of the cervical spine
were also generated.

[Series 5: ax head bone · axial · 0.44mm/px · z∈[+1117,+1237]mm · 6 of 86 slices shown, 8 images]
[im 13/86  soft-tissue]
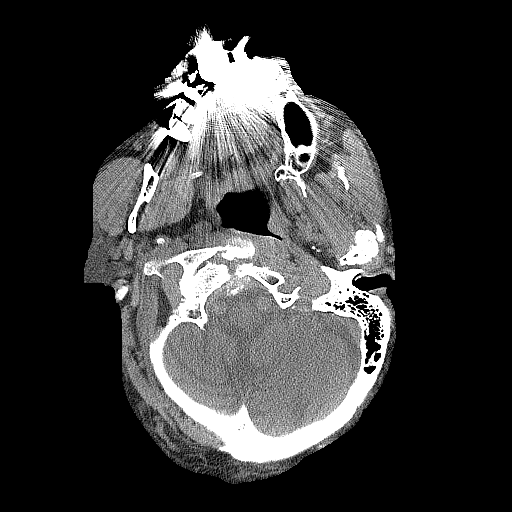
[im 13/86  bone]
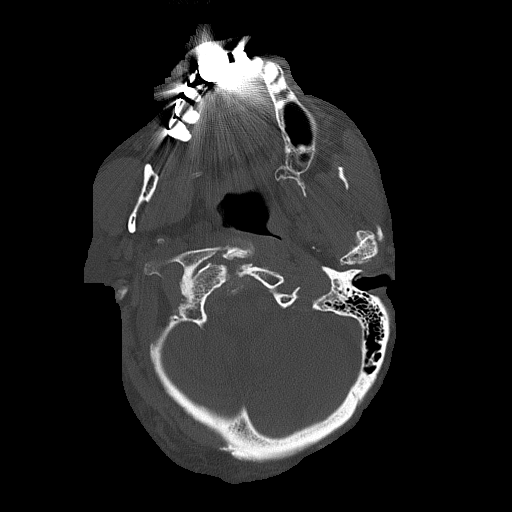
[im 25/86  bone]
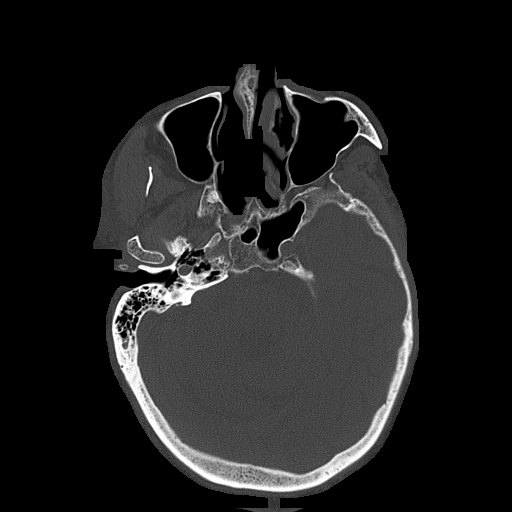
[im 37/86  bone]
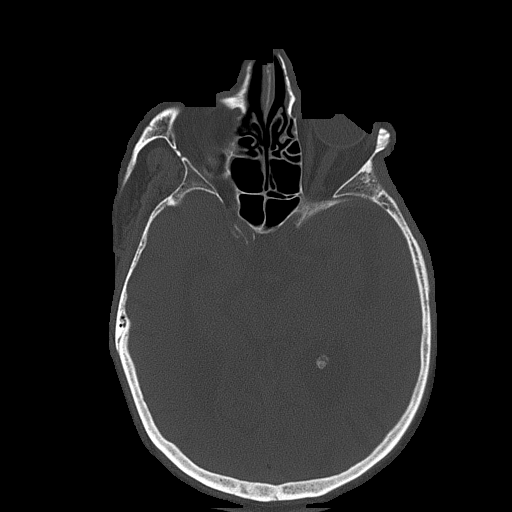
[im 49/86  bone]
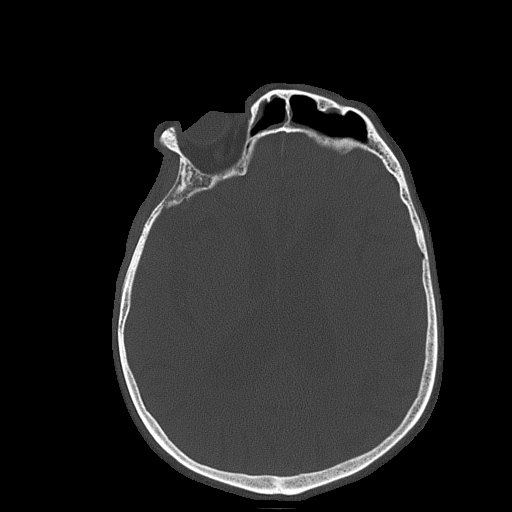
[im 61/86  soft-tissue]
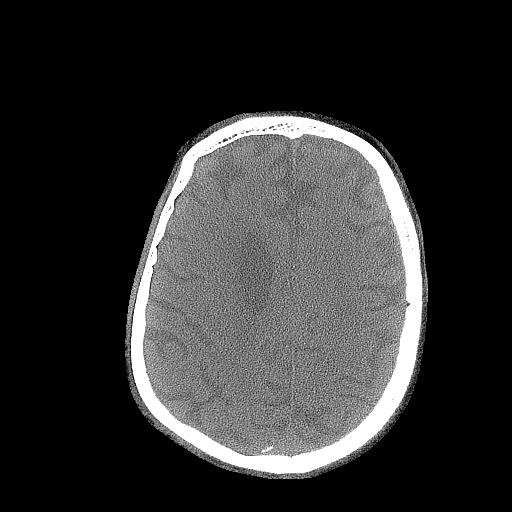
[im 61/86  bone]
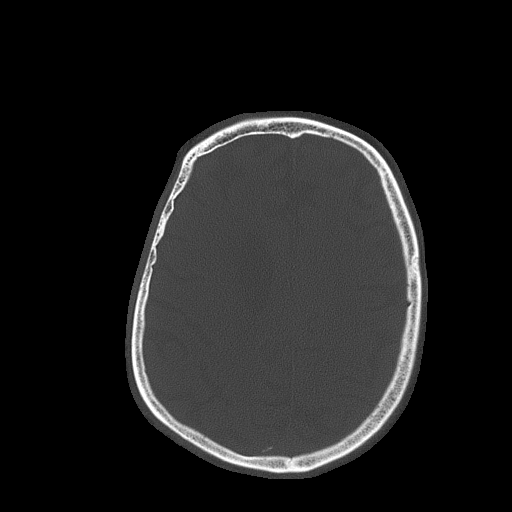
[im 73/86  bone]
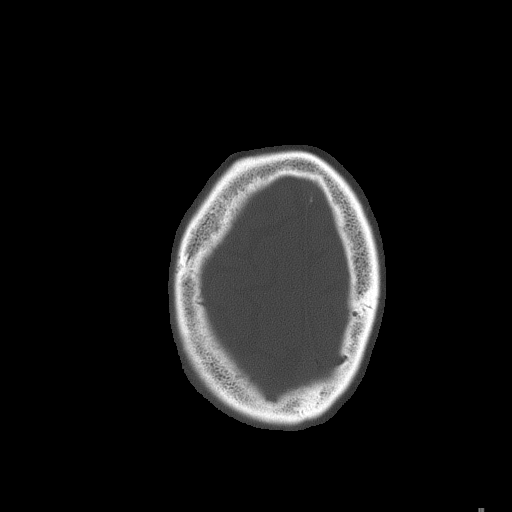

[Series 6: head without cor · coronal · non-contrast · 0.31mm/px · 3 of 67 slices shown]
[im 14/67  bone]
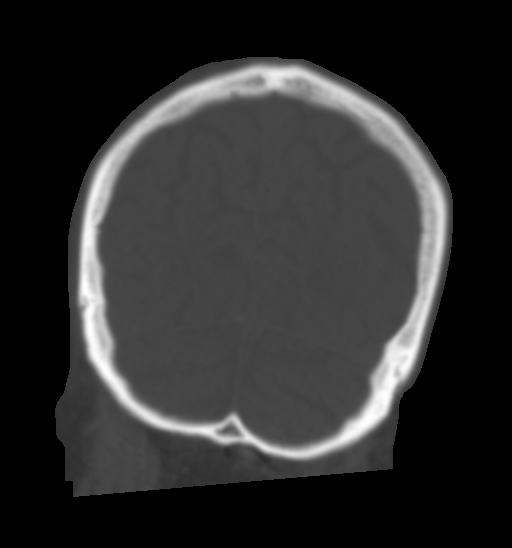
[im 27/67  bone]
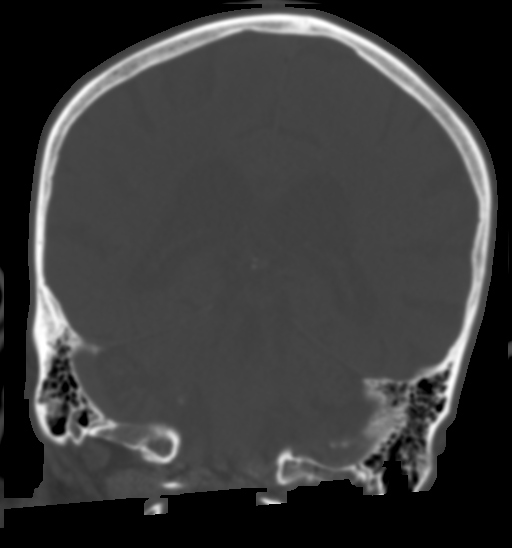
[im 40/67  bone]
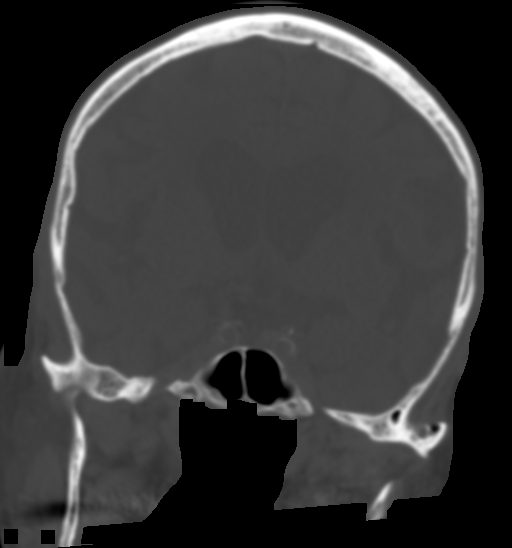

[Series 7: head without sag · sagittal · non-contrast · 0.32mm/px · 6 of 59 slices shown]
[im 20/59  bone]
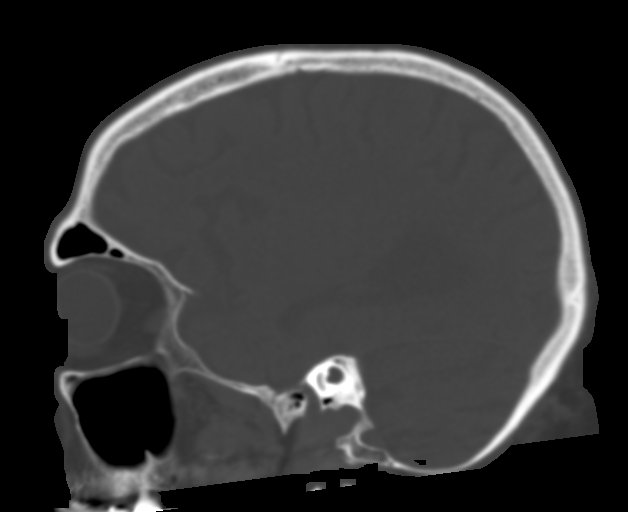
[im 25/59  bone]
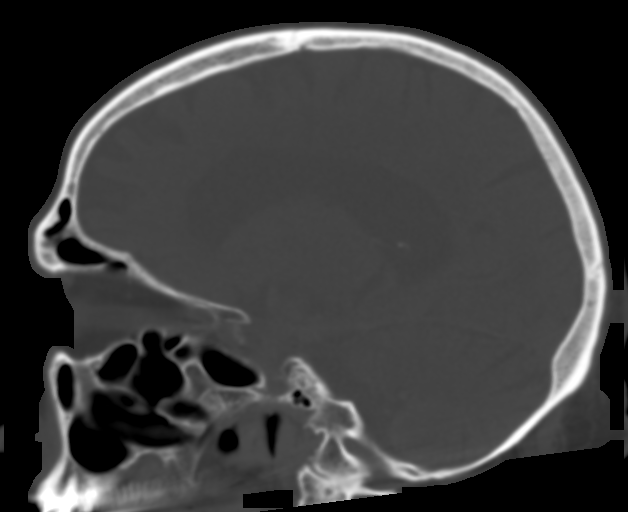
[im 29/59  soft-tissue]
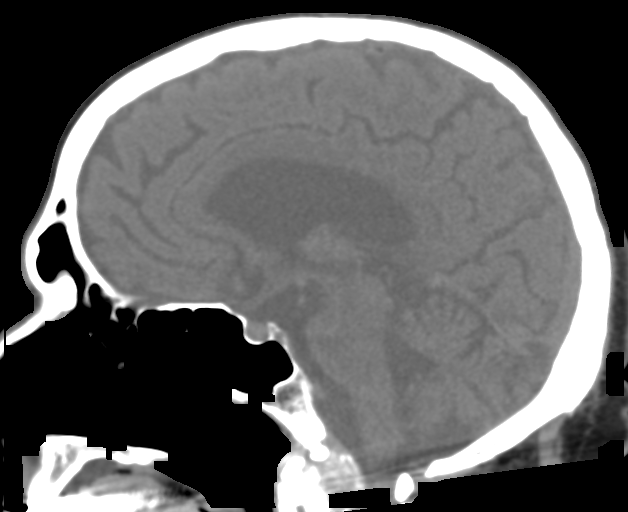
[im 30/59  bone]
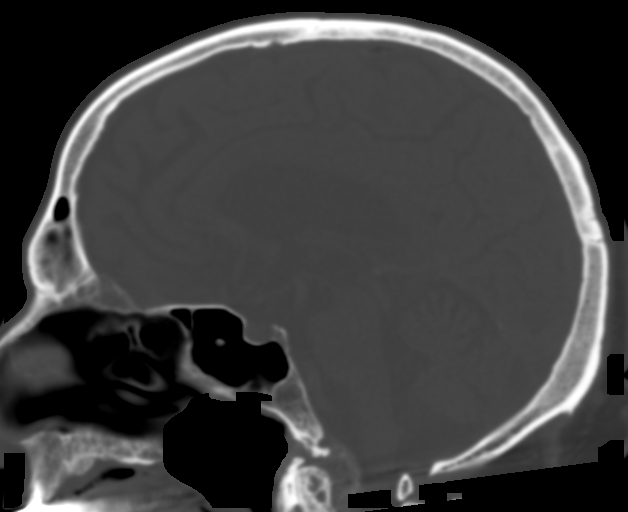
[im 34/59  bone]
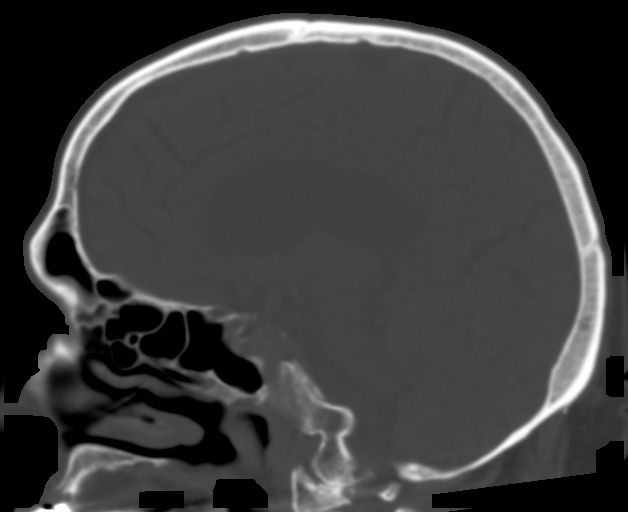
[im 39/59  bone]
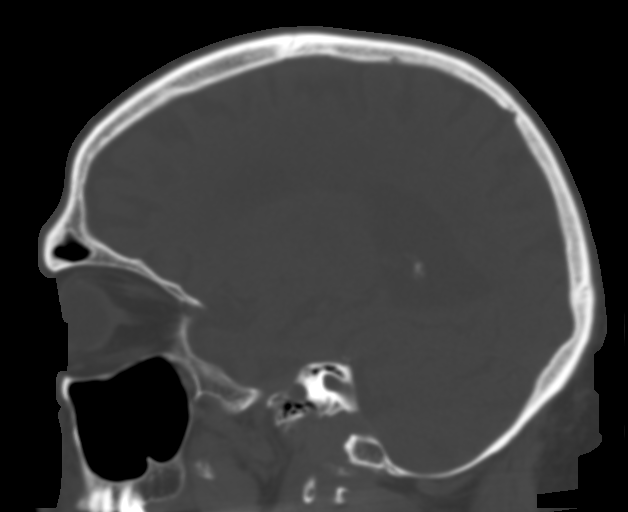

[15 of 36 positions shown; findings below may reference images not displayed]

FINDINGS: CT HEAD FINDINGS

Brain: No evidence of acute infarction, hemorrhage, hydrocephalus,
extra-axial collection or mass lesion/mass effect.

Ventricular and sulcal enlargement is noted consistent with moderate
generalized atrophy. Mild periventricular white matter
hypoattenuation is present consistent with chronic microvascular
ischemic change.

Vascular: No hyperdense vessel or unexpected calcification.

Skull: Normal. Negative for fracture or focal lesion.

Sinuses/Orbits: Globes and orbits are unremarkable. There is mild
inferior frontal sinus mucosal thickening. Sinuses otherwise clear.
Clear mastoid air cells.

Other: None.

CT CERVICAL SPINE FINDINGS

Alignment: Normal.

Skull base and vertebrae: No acute fracture. No primary bone lesion
or focal pathologic process.

Soft tissues and spinal canal: No prevertebral fluid or swelling. No
visible canal hematoma.

Disc levels: Thick bridging osteophytes extend from C2 through the
upper thoracic spine. There is moderate loss of disc height at
C4-C5, C5-C6 and C6-C7 with mild spondylotic disc bulging. No
convincing disc herniation. No bone lesion. The appearance is stable
from the prior study.

Upper chest: No mass or adenopathy.  No acute findings.

Other: None
IMPRESSION: HEAD CT

1. No acute intracranial abnormalities.  No skull fracture.

CERVICAL CT

1. No fracture or acute finding.

## 2018-07-24 IMAGING — DX DG TIBIA/FIBULA 2V*R*
3 series · 3 of 3 positions shown · non-contrast
Comparison: None.

CLINICAL DATA: Fall earlier today.  Right leg pain.

EXAM:
RIGHT TIBIA AND FIBULA - 2 VIEW

[tibia ap]
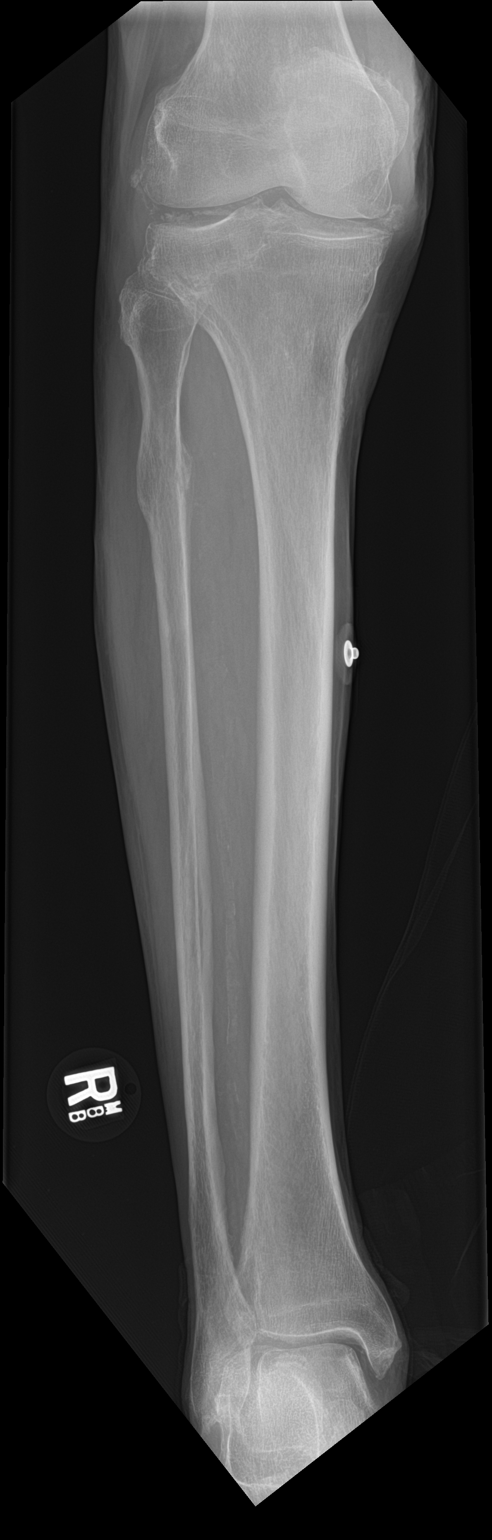

[tibia lat (1 of 2)]
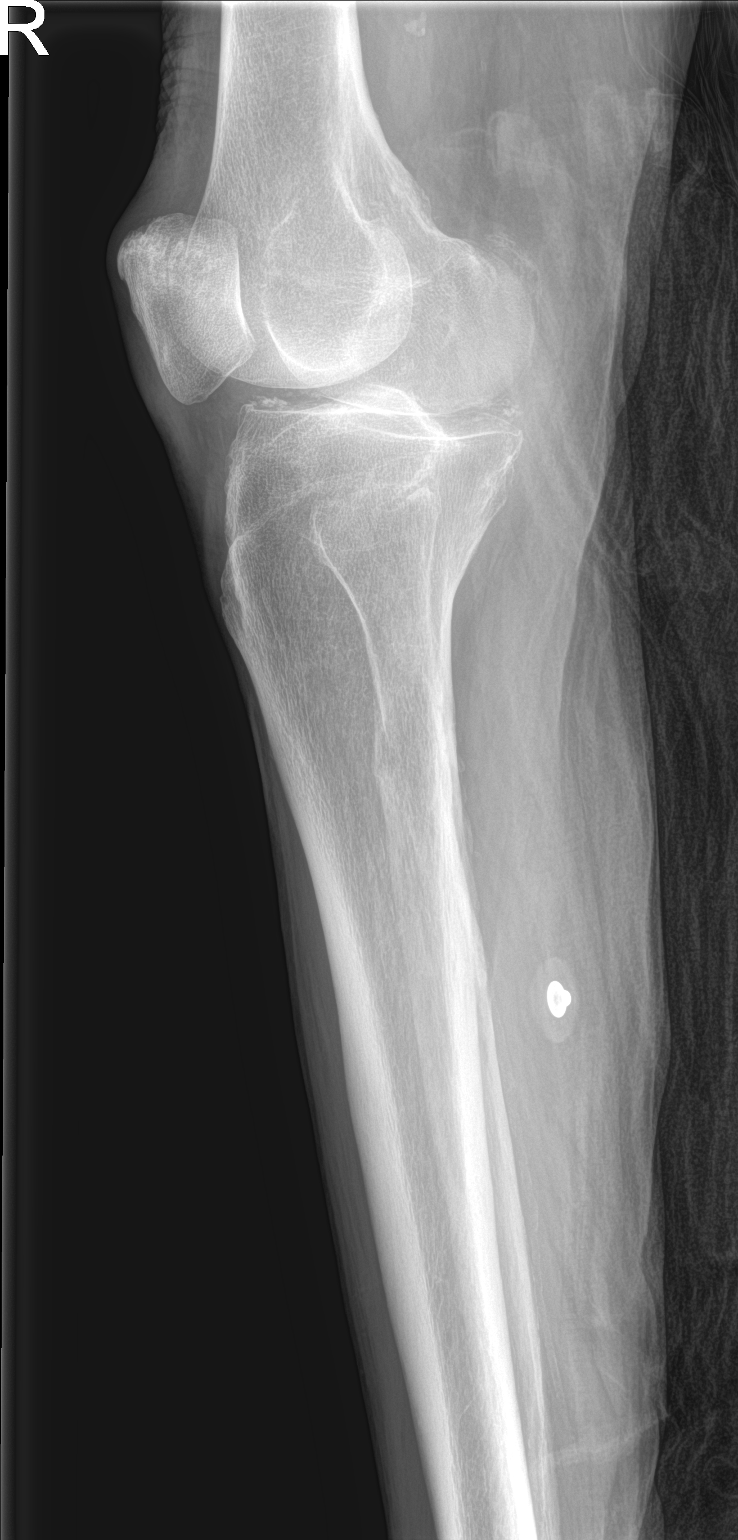

[tibia lat (2 of 2)]
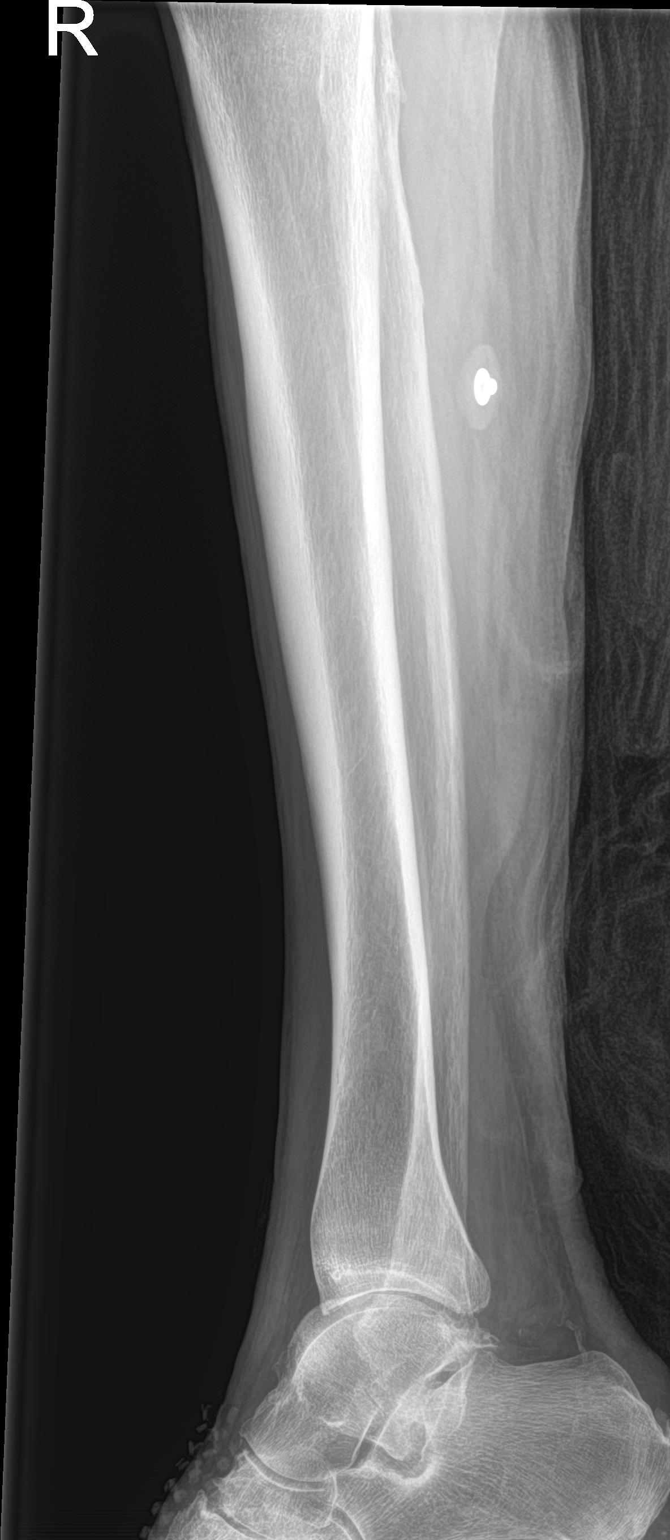

[3 of 3 positions shown; findings below may reference images not displayed]

FINDINGS: No acute fracture. There is expansion of the proximal fibular shaft
consistent with an old, healed fracture. No bone lesions.

Knee and ankle joints are normally aligned. Dense chondrocalcinosis
is noted along the menisci. There are vascular calcifications
extending along the leg arteries. Soft tissues otherwise
unremarkable.
IMPRESSION: 1. No acute fracture or dislocation.
